# Patient Record
Sex: Male | Born: 1970 | Race: White | Hispanic: No | Smoking: Current every day smoker
Health system: Southern US, Community
[De-identification: ages and names within clinical notes are randomized; demographics above are authoritative.]

## PROBLEM LIST (undated history)

## (undated) DIAGNOSIS — F17200 Nicotine dependence, unspecified, uncomplicated: Secondary | ICD-10-CM

## (undated) DIAGNOSIS — K589 Irritable bowel syndrome without diarrhea: Secondary | ICD-10-CM

## (undated) DIAGNOSIS — Z87442 Personal history of urinary calculi: Secondary | ICD-10-CM

## (undated) DIAGNOSIS — D1803 Hemangioma of intra-abdominal structures: Secondary | ICD-10-CM

## (undated) DIAGNOSIS — IMO0002 Reserved for concepts with insufficient information to code with codable children: Secondary | ICD-10-CM

## (undated) DIAGNOSIS — M5410 Radiculopathy, site unspecified: Secondary | ICD-10-CM

## (undated) HISTORY — PX: DENTAL SURGERY: SHX609

## (undated) HISTORY — PX: BACK SURGERY: SHX140

## (undated) HISTORY — PX: HERNIA REPAIR: SHX51

---

## 2008-03-31 HISTORY — PX: LUMBAR DISC SURGERY: SHX700

## 2009-07-29 ENCOUNTER — Emergency Department (HOSPITAL_COMMUNITY): Admission: EM | Admit: 2009-07-29 | Discharge: 2009-07-29 | Payer: Self-pay | Admitting: Emergency Medicine

## 2012-03-31 DIAGNOSIS — Z87442 Personal history of urinary calculi: Secondary | ICD-10-CM

## 2012-03-31 HISTORY — DX: Personal history of urinary calculi: Z87.442

## 2013-04-18 ENCOUNTER — Other Ambulatory Visit: Payer: Self-pay | Admitting: Orthopedic Surgery

## 2013-04-21 ENCOUNTER — Encounter (HOSPITAL_COMMUNITY): Payer: Self-pay | Admitting: Pharmacy Technician

## 2013-04-22 ENCOUNTER — Encounter (HOSPITAL_COMMUNITY): Payer: Self-pay

## 2013-04-22 ENCOUNTER — Encounter (HOSPITAL_COMMUNITY)
Admission: RE | Admit: 2013-04-22 | Discharge: 2013-04-22 | Disposition: A | Discharge status: Home / Self Care | Payer: BC Managed Care – PPO | Source: Ambulatory Visit | Attending: Orthopedic Surgery | Admitting: Orthopedic Surgery

## 2013-04-22 ENCOUNTER — Ambulatory Visit (HOSPITAL_COMMUNITY)
Admission: RE | Admit: 2013-04-22 | Discharge: 2013-04-22 | Disposition: A | Discharge status: Home / Self Care | Payer: BC Managed Care – PPO | Source: Ambulatory Visit | Attending: Orthopedic Surgery | Admitting: Orthopedic Surgery

## 2013-04-22 DIAGNOSIS — Z01810 Encounter for preprocedural cardiovascular examination: Secondary | ICD-10-CM | POA: Insufficient documentation

## 2013-04-22 DIAGNOSIS — Z01812 Encounter for preprocedural laboratory examination: Secondary | ICD-10-CM | POA: Insufficient documentation

## 2013-04-22 DIAGNOSIS — Z01818 Encounter for other preprocedural examination: Secondary | ICD-10-CM | POA: Insufficient documentation

## 2013-04-22 HISTORY — DX: Irritable bowel syndrome, unspecified: K58.9

## 2013-04-22 HISTORY — DX: Reserved for concepts with insufficient information to code with codable children: IMO0002

## 2013-04-22 HISTORY — DX: Hemangioma of intra-abdominal structures: D18.03

## 2013-04-22 HISTORY — DX: Nicotine dependence, unspecified, uncomplicated: F17.200

## 2013-04-22 HISTORY — DX: Personal history of urinary calculi: Z87.442

## 2013-04-22 LAB — COMPREHENSIVE METABOLIC PANEL
ALBUMIN: 4 g/dL (ref 3.5–5.2)
ALT: 40 U/L (ref 0–53)
AST: 31 U/L (ref 0–37)
Alkaline Phosphatase: 67 U/L (ref 39–117)
BUN: 11 mg/dL (ref 6–23)
CALCIUM: 9.1 mg/dL (ref 8.4–10.5)
CHLORIDE: 100 meq/L (ref 96–112)
CO2: 29 mEq/L (ref 19–32)
Creatinine, Ser: 1 mg/dL (ref 0.50–1.35)
GLUCOSE: 71 mg/dL (ref 70–99)
POTASSIUM: 4.4 meq/L (ref 3.7–5.3)
SODIUM: 141 meq/L (ref 137–147)
TOTAL PROTEIN: 7.1 g/dL (ref 6.0–8.3)
Total Bilirubin: 0.2 mg/dL — ABNORMAL LOW (ref 0.3–1.2)

## 2013-04-22 LAB — URINALYSIS, ROUTINE W REFLEX MICROSCOPIC
Bilirubin Urine: NEGATIVE
GLUCOSE, UA: NEGATIVE mg/dL
HGB URINE DIPSTICK: NEGATIVE
Ketones, ur: NEGATIVE mg/dL
LEUKOCYTES UA: NEGATIVE
Nitrite: NEGATIVE
PROTEIN: NEGATIVE mg/dL
SPECIFIC GRAVITY, URINE: 1.01 (ref 1.005–1.030)
Urobilinogen, UA: 0.2 mg/dL (ref 0.0–1.0)
pH: 7 (ref 5.0–8.0)

## 2013-04-22 LAB — CBC WITH DIFFERENTIAL/PLATELET
BASOS ABS: 0 10*3/uL (ref 0.0–0.1)
Basophils Relative: 0 % (ref 0–1)
EOS PCT: 3 % (ref 0–5)
Eosinophils Absolute: 0.2 10*3/uL (ref 0.0–0.7)
HCT: 38.6 % — ABNORMAL LOW (ref 39.0–52.0)
HEMOGLOBIN: 12.9 g/dL — AB (ref 13.0–17.0)
Lymphocytes Relative: 44 % (ref 12–46)
Lymphs Abs: 2.8 10*3/uL (ref 0.7–4.0)
MCH: 32 pg (ref 26.0–34.0)
MCHC: 33.4 g/dL (ref 30.0–36.0)
MCV: 95.8 fL (ref 78.0–100.0)
Monocytes Absolute: 0.7 10*3/uL (ref 0.1–1.0)
Monocytes Relative: 11 % (ref 3–12)
NEUTROS ABS: 2.7 10*3/uL (ref 1.7–7.7)
Neutrophils Relative %: 43 % (ref 43–77)
Platelets: 295 10*3/uL (ref 150–400)
RBC: 4.03 MIL/uL — AB (ref 4.22–5.81)
RDW: 12.2 % (ref 11.5–15.5)
WBC: 6.3 10*3/uL (ref 4.0–10.5)

## 2013-04-22 LAB — SURGICAL PCR SCREEN
MRSA, PCR: NEGATIVE
Staphylococcus aureus: POSITIVE — AB

## 2013-04-22 LAB — ABO/RH: ABO/RH(D): O POS

## 2013-04-22 LAB — TYPE AND SCREEN
ABO/RH(D): O POS
ANTIBODY SCREEN: NEGATIVE

## 2013-04-22 LAB — PROTIME-INR
INR: 0.94 (ref 0.00–1.49)
Prothrombin Time: 12.4 seconds (ref 11.6–15.2)

## 2013-04-22 LAB — APTT: aPTT: 32 seconds (ref 24–37)

## 2013-04-22 NOTE — Pre-Procedure Instructions (Signed)
Travis Peters  04/22/2013   Your procedure is scheduled on:  Wednesday, Feb 4  Report to [facility] Short Stay,Main entrance A at 0630 AM.  Call this number if you have problems the morning of surgery: [phone]   Remember:   Do not eat food or drink liquids after midnight.Tuesday,   Take these medicines the morning of surgery with A SIP OF WATER: pregabalin(  Lyrica), escitalopram( Lexapro),cyclobenzaprine (Flexeril)   Do not wear jewelry   Do not wear lotions, powders, or perfumes. You may wear deodorant.             Men may shave face and neck.  Do not bring valuables to the hospital.  [facility] is not responsible   for any belongings or valuables.               Contacts, dentures or bridgework may not be worn into surgery.  Leave suitcase in the car. After surgery it may be brought to your room.  For patients admitted to the hospital, discharge time is determined by your   treatment team.                Special Instructions: Shower using CHG 2 nights before surgery and the night before surgery.  If you shower the day of surgery use CHG.  Use special wash - you have one bottle of CHG for all showers.  You should use approximately 1/3 of the bottle for each shower.   Please read over the following fact sheets that you were given: Pain Booklet, Coughing and Deep Breathing, Blood Transfusion Information, MRSA Information and Surgical Site Infection Prevention

## 2013-04-27 ENCOUNTER — Other Ambulatory Visit (HOSPITAL_COMMUNITY): Payer: Self-pay

## 2013-05-03 MED ORDER — CHLORHEXIDINE GLUCONATE 4 % EX LIQD: 1.0000 "application " | Freq: Once | CUTANEOUS | Status: DC

## 2013-05-03 MED ORDER — CEFAZOLIN SODIUM-DEXTROSE 2-3 GM-% IV SOLR
2.0000 g | INTRAVENOUS | Status: AC
  Administered 2013-05-04: 2 g via INTRAVENOUS

## 2013-05-03 MED ORDER — POVIDONE-IODINE 7.5 % EX SOLN
Freq: Once | CUTANEOUS | Status: DC
  Filled 2013-05-03: qty 118

## 2013-05-04 ENCOUNTER — Encounter (HOSPITAL_COMMUNITY): Payer: Self-pay | Admitting: Certified Registered"

## 2013-05-04 ENCOUNTER — Inpatient Hospital Stay (HOSPITAL_COMMUNITY): Payer: BC Managed Care – PPO

## 2013-05-04 ENCOUNTER — Inpatient Hospital Stay (HOSPITAL_COMMUNITY)
Admission: RE | Admit: 2013-05-04 | Discharge: 2013-05-08 | DRG: 460 | Disposition: A | Discharge status: Home Health | Payer: BC Managed Care – PPO | Source: Ambulatory Visit | Attending: Orthopedic Surgery | Admitting: Orthopedic Surgery

## 2013-05-04 ENCOUNTER — Inpatient Hospital Stay (HOSPITAL_COMMUNITY): Payer: BC Managed Care – PPO | Admitting: Certified Registered"

## 2013-05-04 ENCOUNTER — Encounter (HOSPITAL_COMMUNITY)
Admission: RE | Disposition: A | Discharge status: Home Health | Payer: BC Managed Care – PPO | Source: Ambulatory Visit | Attending: Orthopedic Surgery

## 2013-05-04 ENCOUNTER — Encounter (HOSPITAL_COMMUNITY): Payer: BC Managed Care – PPO | Admitting: Certified Registered"

## 2013-05-04 DIAGNOSIS — M5137 Other intervertebral disc degeneration, lumbosacral region: Secondary | ICD-10-CM | POA: Diagnosis present

## 2013-05-04 DIAGNOSIS — M51379 Other intervertebral disc degeneration, lumbosacral region without mention of lumbar back pain or lower extremity pain: Secondary | ICD-10-CM | POA: Diagnosis present

## 2013-05-04 DIAGNOSIS — Z87442 Personal history of urinary calculi: Secondary | ICD-10-CM

## 2013-05-04 DIAGNOSIS — M5126 Other intervertebral disc displacement, lumbar region: Principal | ICD-10-CM | POA: Diagnosis present

## 2013-05-04 DIAGNOSIS — F17200 Nicotine dependence, unspecified, uncomplicated: Secondary | ICD-10-CM | POA: Diagnosis present

## 2013-05-04 DIAGNOSIS — M5410 Radiculopathy, site unspecified: Secondary | ICD-10-CM | POA: Diagnosis present

## 2013-05-04 DIAGNOSIS — M5380 Other specified dorsopathies, site unspecified: Secondary | ICD-10-CM | POA: Diagnosis present

## 2013-05-04 DIAGNOSIS — K589 Irritable bowel syndrome without diarrhea: Secondary | ICD-10-CM | POA: Diagnosis present

## 2013-05-04 HISTORY — DX: Radiculopathy, site unspecified: M54.10

## 2013-05-04 SURGERY — POSTERIOR LUMBAR FUSION 1 LEVEL
Anesthesia: General | Site: Spine Lumbar | Laterality: Left

## 2013-05-04 MED ORDER — PHENOL 1.4 % MT LIQD: 1.0000 | OROMUCOSAL | Status: DC | PRN

## 2013-05-04 MED ORDER — SUCCINYLCHOLINE CHLORIDE 20 MG/ML IJ SOLN
INTRAMUSCULAR | Status: DC | PRN
  Administered 2013-05-04: 100 mg via INTRAVENOUS

## 2013-05-04 MED ORDER — NEOSTIGMINE METHYLSULFATE 1 MG/ML IJ SOLN
INTRAMUSCULAR | Status: AC
  Filled 2013-05-04: qty 10

## 2013-05-04 MED ORDER — DIPHENHYDRAMINE HCL 50 MG/ML IJ SOLN
12.5000 mg | Freq: Four times a day (QID) | INTRAMUSCULAR | Status: DC | PRN
  Filled 2013-05-04: qty 0.25

## 2013-05-04 MED ORDER — MIDAZOLAM HCL 2 MG/2ML IJ SOLN
0.5000 mg | Freq: Once | INTRAMUSCULAR | Status: AC
  Administered 2013-05-04: 0.5 mg via INTRAVENOUS

## 2013-05-04 MED ORDER — MORPHINE SULFATE (PF) 1 MG/ML IV SOLN
INTRAVENOUS | Status: AC
  Filled 2013-05-04: qty 25

## 2013-05-04 MED ORDER — HYDROMORPHONE 0.3 MG/ML IV SOLN
INTRAVENOUS | Status: DC
  Administered 2013-05-04: 16:00:00 via INTRAVENOUS

## 2013-05-04 MED ORDER — MIDAZOLAM HCL 2 MG/2ML IJ SOLN
INTRAMUSCULAR | Status: AC
  Filled 2013-05-04: qty 2

## 2013-05-04 MED ORDER — MENTHOL 3 MG MT LOZG: 1.0000 | LOZENGE | OROMUCOSAL | Status: DC | PRN

## 2013-05-04 MED ORDER — HYDROMORPHONE HCL PF 1 MG/ML IJ SOLN
INTRAMUSCULAR | Status: AC
  Filled 2013-05-04: qty 2

## 2013-05-04 MED ORDER — ROCURONIUM BROMIDE 50 MG/5ML IV SOLN
INTRAVENOUS | Status: AC
  Filled 2013-05-04: qty 1

## 2013-05-04 MED ORDER — THROMBIN 20000 UNITS EX SOLR
CUTANEOUS | Status: DC | PRN
  Administered 2013-05-04: 20000 [IU] via TOPICAL

## 2013-05-04 MED ORDER — LIDOCAINE HCL (CARDIAC) 20 MG/ML IV SOLN
INTRAVENOUS | Status: DC | PRN
  Administered 2013-05-04: 60 mg via INTRAVENOUS

## 2013-05-04 MED ORDER — THROMBIN 20000 UNITS EX SOLR
CUTANEOUS | Status: AC
Start: 2013-05-04 — End: 2013-05-04
  Filled 2013-05-04: qty 40000

## 2013-05-04 MED ORDER — DIAZEPAM 5 MG PO TABS
5.0000 mg | ORAL_TABLET | Freq: Four times a day (QID) | ORAL | Status: DC | PRN
  Administered 2013-05-04 – 2013-05-08 (×8): 5 mg via ORAL
  Filled 2013-05-04 (×9): qty 1

## 2013-05-04 MED ORDER — SUFENTANIL CITRATE 50 MCG/ML IV SOLN
INTRAVENOUS | Status: DC | PRN
  Administered 2013-05-04 (×2): 10 ug via INTRAVENOUS
  Administered 2013-05-04 (×2): 20 ug via INTRAVENOUS
  Administered 2013-05-04 (×2): 10 ug via INTRAVENOUS

## 2013-05-04 MED ORDER — PROPOFOL 10 MG/ML IV BOLUS
INTRAVENOUS | Status: AC
  Filled 2013-05-04: qty 20

## 2013-05-04 MED ORDER — PROPOFOL 10 MG/ML IV BOLUS
INTRAVENOUS | Status: DC | PRN
  Administered 2013-05-04: 200 mg via INTRAVENOUS

## 2013-05-04 MED ORDER — SODIUM CHLORIDE 0.9 % IV SOLN
250.0000 mL | INTRAVENOUS | Status: DC
Start: 2013-05-04 — End: 2013-05-08

## 2013-05-04 MED ORDER — 0.9 % SODIUM CHLORIDE (POUR BTL) OPTIME
TOPICAL | Status: DC | PRN
  Administered 2013-05-04: 1000 mL

## 2013-05-04 MED ORDER — MORPHINE SULFATE 2 MG/ML IJ SOLN
1.0000 mg | INTRAMUSCULAR | Status: DC | PRN
  Administered 2013-05-04 (×2): 2 mg via INTRAVENOUS
  Administered 2013-05-05 – 2013-05-06 (×5): 4 mg via INTRAVENOUS
  Filled 2013-05-04 (×5): qty 2

## 2013-05-04 MED ORDER — SODIUM CHLORIDE 0.9 % IJ SOLN: 9.0000 mL | INTRAMUSCULAR | Status: DC | PRN

## 2013-05-04 MED ORDER — HYDROMORPHONE HCL PF 1 MG/ML IJ SOLN
0.2500 mg | INTRAMUSCULAR | Status: DC | PRN
  Administered 2013-05-04 (×4): 0.5 mg via INTRAVENOUS

## 2013-05-04 MED ORDER — NALOXONE HCL 0.4 MG/ML IJ SOLN: 0.4000 mg | INTRAMUSCULAR | Status: DC | PRN

## 2013-05-04 MED ORDER — ACETAMINOPHEN 650 MG RE SUPP: 650.0000 mg | RECTAL | Status: DC | PRN

## 2013-05-04 MED ORDER — SUFENTANIL CITRATE 50 MCG/ML IV SOLN
INTRAVENOUS | Status: AC
  Filled 2013-05-04: qty 1

## 2013-05-04 MED ORDER — PROPOFOL 10 MG/ML IV EMUL
INTRAVENOUS | Status: AC
  Filled 2013-05-04: qty 100

## 2013-05-04 MED ORDER — ACETAMINOPHEN 325 MG PO TABS: 650.0000 mg | ORAL_TABLET | ORAL | Status: DC | PRN

## 2013-05-04 MED ORDER — BISACODYL 5 MG PO TBEC
5.0000 mg | DELAYED_RELEASE_TABLET | Freq: Every day | ORAL | Status: DC | PRN
  Filled 2013-05-04: qty 1

## 2013-05-04 MED ORDER — BUPIVACAINE-EPINEPHRINE 0.25% -1:200000 IJ SOLN
INTRAMUSCULAR | Status: DC | PRN
  Administered 2013-05-04: 28 mL

## 2013-05-04 MED ORDER — SODIUM CHLORIDE 0.9 % IV SOLN
INTRAVENOUS | Status: DC
  Administered 2013-05-05 – 2013-05-06 (×3): via INTRAVENOUS

## 2013-05-04 MED ORDER — ONDANSETRON HCL 4 MG/2ML IJ SOLN
INTRAMUSCULAR | Status: AC
  Filled 2013-05-04: qty 2

## 2013-05-04 MED ORDER — DIPHENHYDRAMINE HCL 12.5 MG/5ML PO ELIX
12.5000 mg | ORAL_SOLUTION | Freq: Four times a day (QID) | ORAL | Status: DC | PRN
  Filled 2013-05-04: qty 5

## 2013-05-04 MED ORDER — ONDANSETRON HCL 4 MG/2ML IJ SOLN: 4.0000 mg | Freq: Four times a day (QID) | INTRAMUSCULAR | Status: DC | PRN

## 2013-05-04 MED ORDER — MORPHINE SULFATE 2 MG/ML IJ SOLN
INTRAMUSCULAR | Status: AC
  Filled 2013-05-04: qty 1

## 2013-05-04 MED ORDER — SODIUM CHLORIDE 0.9 % IJ SOLN
INTRAMUSCULAR | Status: AC
Start: 2013-05-04 — End: 2013-05-04
  Filled 2013-05-04: qty 10

## 2013-05-04 MED ORDER — PROPOFOL INFUSION 10 MG/ML OPTIME
INTRAVENOUS | Status: DC | PRN
  Administered 2013-05-04: 75 ug/kg/min via INTRAVENOUS

## 2013-05-04 MED ORDER — LIDOCAINE HCL (CARDIAC) 20 MG/ML IV SOLN
INTRAVENOUS | Status: AC
  Filled 2013-05-04: qty 5

## 2013-05-04 MED ORDER — MIDAZOLAM HCL 5 MG/5ML IJ SOLN
INTRAMUSCULAR | Status: DC | PRN
  Administered 2013-05-04: 2 mg via INTRAVENOUS

## 2013-05-04 MED ORDER — BUPIVACAINE-EPINEPHRINE (PF) 0.25% -1:200000 IJ SOLN
INTRAMUSCULAR | Status: AC
Start: 2013-05-04 — End: 2013-05-04
  Filled 2013-05-04: qty 30

## 2013-05-04 MED ORDER — CEFAZOLIN SODIUM 1-5 GM-% IV SOLN
1.0000 g | Freq: Three times a day (TID) | INTRAVENOUS | Status: AC
  Administered 2013-05-04 – 2013-05-05 (×2): 1 g via INTRAVENOUS
  Filled 2013-05-04 (×2): qty 50

## 2013-05-04 MED ORDER — ZOLPIDEM TARTRATE 5 MG PO TABS: 5.0000 mg | ORAL_TABLET | Freq: Every evening | ORAL | Status: DC | PRN

## 2013-05-04 MED ORDER — DIPHENHYDRAMINE HCL 12.5 MG/5ML PO ELIX: 12.5000 mg | ORAL_SOLUTION | Freq: Four times a day (QID) | ORAL | Status: DC | PRN

## 2013-05-04 MED ORDER — MIDAZOLAM HCL 2 MG/2ML IJ SOLN
INTRAMUSCULAR | Status: AC
Start: 2013-05-04 — End: 2013-05-04
  Filled 2013-05-04: qty 2

## 2013-05-04 MED ORDER — FLEET ENEMA 7-19 GM/118ML RE ENEM: 1.0000 | ENEMA | Freq: Once | RECTAL | Status: AC | PRN

## 2013-05-04 MED ORDER — ALUM & MAG HYDROXIDE-SIMETH 200-200-20 MG/5ML PO SUSP: 30.0000 mL | Freq: Four times a day (QID) | ORAL | Status: DC | PRN

## 2013-05-04 MED ORDER — LACTATED RINGERS IV SOLN
INTRAVENOUS | Status: DC | PRN
  Administered 2013-05-04 (×3): via INTRAVENOUS

## 2013-05-04 MED ORDER — ONDANSETRON HCL 4 MG/2ML IJ SOLN
INTRAMUSCULAR | Status: DC | PRN
  Administered 2013-05-04: 4 mg via INTRAVENOUS

## 2013-05-04 MED ORDER — MORPHINE SULFATE (PF) 1 MG/ML IV SOLN
INTRAVENOUS | Status: DC
  Administered 2013-05-04: 22 mg via INTRAVENOUS
  Administered 2013-05-04: 13:00:00 via INTRAVENOUS

## 2013-05-04 MED ORDER — OXYCODONE-ACETAMINOPHEN 5-325 MG PO TABS
1.0000 | ORAL_TABLET | ORAL | Status: DC | PRN
  Administered 2013-05-05 – 2013-05-08 (×12): 2 via ORAL
  Filled 2013-05-04 (×12): qty 2

## 2013-05-04 MED ORDER — SODIUM CHLORIDE 0.9 % IJ SOLN: 3.0000 mL | INTRAMUSCULAR | Status: DC | PRN

## 2013-05-04 MED ORDER — DIPHENHYDRAMINE HCL 50 MG/ML IJ SOLN: 12.5000 mg | Freq: Four times a day (QID) | INTRAMUSCULAR | Status: DC | PRN

## 2013-05-04 MED ORDER — PREGABALIN 50 MG PO CAPS: 75.0000 mg | ORAL_CAPSULE | Freq: Every evening | ORAL | Status: DC | PRN

## 2013-05-04 MED ORDER — ONDANSETRON HCL 4 MG/2ML IJ SOLN
4.0000 mg | Freq: Four times a day (QID) | INTRAMUSCULAR | Status: DC | PRN
Start: 2013-05-04 — End: 2013-05-05
  Filled 2013-05-04: qty 2

## 2013-05-04 MED ORDER — THROMBIN 20000 UNITS EX SOLR
CUTANEOUS | Status: DC | PRN
  Administered 2013-05-04: 10:00:00

## 2013-05-04 MED ORDER — HYDROMORPHONE 0.3 MG/ML IV SOLN
INTRAVENOUS | Status: DC
  Administered 2013-05-04: 2.4 mg via INTRAVENOUS
  Administered 2013-05-05: 3.3 mg via INTRAVENOUS
  Administered 2013-05-05: 3.58 mg via INTRAVENOUS
  Filled 2013-05-04: qty 25

## 2013-05-04 MED ORDER — CLONAZEPAM 0.5 MG PO TABS
0.5000 mg | ORAL_TABLET | Freq: Two times a day (BID) | ORAL | Status: DC | PRN
  Filled 2013-05-04 (×2): qty 1

## 2013-05-04 MED ORDER — ROCURONIUM BROMIDE 100 MG/10ML IV SOLN
INTRAVENOUS | Status: DC | PRN
  Administered 2013-05-04: 20 mg via INTRAVENOUS
  Administered 2013-05-04: 50 mg via INTRAVENOUS

## 2013-05-04 MED ORDER — HYDROMORPHONE 0.3 MG/ML IV SOLN
INTRAVENOUS | Status: AC
  Filled 2013-05-04: qty 25

## 2013-05-04 MED ORDER — SENNOSIDES-DOCUSATE SODIUM 8.6-50 MG PO TABS: 1.0000 | ORAL_TABLET | Freq: Every evening | ORAL | Status: DC | PRN

## 2013-05-04 MED ORDER — NALOXONE HCL 0.4 MG/ML IJ SOLN
0.4000 mg | INTRAMUSCULAR | Status: DC | PRN
  Filled 2013-05-04: qty 1

## 2013-05-04 MED ORDER — ESCITALOPRAM OXALATE 20 MG PO TABS
20.0000 mg | ORAL_TABLET | Freq: Every day | ORAL | Status: DC
  Administered 2013-05-05 – 2013-05-08 (×4): 20 mg via ORAL
  Filled 2013-05-04 (×4): qty 1

## 2013-05-04 MED ORDER — DOCUSATE SODIUM 100 MG PO CAPS
100.0000 mg | ORAL_CAPSULE | Freq: Two times a day (BID) | ORAL | Status: DC
  Administered 2013-05-04 – 2013-05-08 (×8): 100 mg via ORAL
  Filled 2013-05-04 (×10): qty 1

## 2013-05-04 MED ORDER — METHYLENE BLUE 1 % INJ SOLN
INTRAMUSCULAR | Status: AC
  Filled 2013-05-04: qty 10

## 2013-05-04 MED ORDER — SODIUM CHLORIDE 0.9 % IJ SOLN
3.0000 mL | Freq: Two times a day (BID) | INTRAMUSCULAR | Status: DC
  Administered 2013-05-06 – 2013-05-08 (×4): 3 mL via INTRAVENOUS

## 2013-05-04 MED ORDER — ONDANSETRON HCL 4 MG/2ML IJ SOLN
4.0000 mg | INTRAMUSCULAR | Status: DC | PRN
  Administered 2013-05-05 – 2013-05-06 (×4): 4 mg via INTRAVENOUS
  Filled 2013-05-04 (×4): qty 2

## 2013-05-04 SURGICAL SUPPLY — 77 items
BENZOIN TINCTURE PRP APPL 2/3 (GAUZE/BANDAGES/DRESSINGS) ×3 IMPLANT
BLADE SURG ROTATE 9660 (MISCELLANEOUS) IMPLANT
BUR ROUND PRECISION 4.0 (BURR) ×2 IMPLANT
BUR ROUND PRECISION 4.0MM (BURR) ×1
CAGE CONCORDE BULLET 9X10X27 (Cage) ×3 IMPLANT
CARTRIDGE OIL MAESTRO DRILL (MISCELLANEOUS) ×2 IMPLANT
CLOSURE WOUND 1/2 X4 (GAUZE/BANDAGES/DRESSINGS) ×1
CLOTH BEACON ORANGE TIMEOUT ST (SAFETY) ×3 IMPLANT
CONT SPEC STER OR (MISCELLANEOUS) ×3 IMPLANT
CORDS BIPOLAR (ELECTRODE) ×3 IMPLANT
COVER SURGICAL LIGHT HANDLE (MISCELLANEOUS) ×3 IMPLANT
DIFFUSER DRILL AIR PNEUMATIC (MISCELLANEOUS) ×6 IMPLANT
DRAIN CHANNEL 15F RND FF W/TCR (WOUND CARE) IMPLANT
DRAPE C-ARM 42X72 X-RAY (DRAPES) ×3 IMPLANT
DRAPE ORTHO SPLIT 77X108 STRL (DRAPES) ×2
DRAPE POUCH INSTRU U-SHP 10X18 (DRAPES) ×3 IMPLANT
DRAPE SURG 17X23 STRL (DRAPES) ×9 IMPLANT
DRAPE SURG ORHT 6 SPLT 77X108 (DRAPES) ×1 IMPLANT
DURAPREP 26ML APPLICATOR (WOUND CARE) ×3 IMPLANT
ELECT BLADE 4.0 EZ CLEAN MEGAD (MISCELLANEOUS) ×3
ELECT CAUTERY BLADE 6.4 (BLADE) ×3 IMPLANT
ELECT REM PT RETURN 9FT ADLT (ELECTROSURGICAL) ×3
ELECTRODE BLDE 4.0 EZ CLN MEGD (MISCELLANEOUS) ×1 IMPLANT
ELECTRODE REM PT RTRN 9FT ADLT (ELECTROSURGICAL) ×1 IMPLANT
EVACUATOR SILICONE 100CC (DRAIN) IMPLANT
GAUZE SPONGE 4X4 16PLY XRAY LF (GAUZE/BANDAGES/DRESSINGS) ×12 IMPLANT
GLOVE BIO SURGEON STRL SZ7 (GLOVE) ×3 IMPLANT
GLOVE BIO SURGEON STRL SZ8 (GLOVE) ×3 IMPLANT
GLOVE BIOGEL PI IND STRL 7.5 (GLOVE) ×1 IMPLANT
GLOVE BIOGEL PI IND STRL 8 (GLOVE) ×1 IMPLANT
GLOVE BIOGEL PI INDICATOR 7.5 (GLOVE) ×2
GLOVE BIOGEL PI INDICATOR 8 (GLOVE) ×2
GOWN STRL NON-REIN LRG LVL3 (GOWN DISPOSABLE) ×6 IMPLANT
GOWN STRL REIN XL XLG (GOWN DISPOSABLE) ×3 IMPLANT
IV CATH 14GX2 1/4 (CATHETERS) ×3 IMPLANT
KIT BASIN OR (CUSTOM PROCEDURE TRAY) ×3 IMPLANT
KIT POSITION SURG JACKSON T1 (MISCELLANEOUS) ×3 IMPLANT
KIT ROOM TURNOVER OR (KITS) ×3 IMPLANT
MARKER SKIN DUAL TIP RULER LAB (MISCELLANEOUS) ×3 IMPLANT
MIX DBX 10CC 35% BONE (Bone Implant) ×3 IMPLANT
NEEDLE BONE MARROW 8GX6 FENEST (NEEDLE) IMPLANT
NEEDLE HYPO 25GX1X1/2 BEV (NEEDLE) ×3 IMPLANT
NEEDLE SPNL 18GX3.5 QUINCKE PK (NEEDLE) ×6 IMPLANT
NEURO MONITORING STIM (LABOR (TRAVEL & OVERTIME)) ×3 IMPLANT
NS IRRIG 1000ML POUR BTL (IV SOLUTION) ×3 IMPLANT
OIL CARTRIDGE MAESTRO DRILL (MISCELLANEOUS) ×6
PACK LAMINECTOMY ORTHO (CUSTOM PROCEDURE TRAY) ×3 IMPLANT
PACK UNIVERSAL I (CUSTOM PROCEDURE TRAY) ×3 IMPLANT
PAD ARMBOARD 7.5X6 YLW CONV (MISCELLANEOUS) ×6 IMPLANT
PATTIES SURGICAL .5 X1 (DISPOSABLE) ×3 IMPLANT
PATTIES SURGICAL .5X1.5 (GAUZE/BANDAGES/DRESSINGS) ×3 IMPLANT
ROD PRE BENT EXPEDIUM 35MM (Rod) ×6 IMPLANT
SCREW EXPEDIUM POLYAXIAL 7X40M (Screw) ×6 IMPLANT
SCREW POLYAXIAL 7X45MM (Screw) ×6 IMPLANT
SCREW SET SINGLE INNER (Screw) ×12 IMPLANT
SPONGE GAUZE 4X4 12PLY (GAUZE/BANDAGES/DRESSINGS) ×3 IMPLANT
SPONGE GAUZE 4X4 12PLY STER LF (GAUZE/BANDAGES/DRESSINGS) ×3 IMPLANT
SPONGE INTESTINAL PEANUT (DISPOSABLE) ×3 IMPLANT
SPONGE SURGIFOAM ABS GEL 100 (HEMOSTASIS) ×3 IMPLANT
STRIP CLOSURE SKIN 1/2X4 (GAUZE/BANDAGES/DRESSINGS) ×2 IMPLANT
SURGIFLO TRUKIT (HEMOSTASIS) IMPLANT
SUT MNCRL AB 4-0 PS2 18 (SUTURE) ×6 IMPLANT
SUT VIC AB 0 CT1 18XCR BRD 8 (SUTURE) ×1 IMPLANT
SUT VIC AB 0 CT1 8-18 (SUTURE) ×2
SUT VIC AB 1 CT1 18XCR BRD 8 (SUTURE) ×2 IMPLANT
SUT VIC AB 1 CT1 8-18 (SUTURE) ×4
SUT VIC AB 2-0 CT2 18 VCP726D (SUTURE) ×3 IMPLANT
SYR 20CC LL (SYRINGE) ×3 IMPLANT
SYR BULB IRRIGATION 50ML (SYRINGE) ×3 IMPLANT
SYR CONTROL 10ML LL (SYRINGE) ×3 IMPLANT
SYR TB 1ML LUER SLIP (SYRINGE) ×3 IMPLANT
TAPE CLOTH SURG 4X10 WHT LF (GAUZE/BANDAGES/DRESSINGS) ×3 IMPLANT
TOWEL OR 17X24 6PK STRL BLUE (TOWEL DISPOSABLE) ×3 IMPLANT
TOWEL OR 17X26 10 PK STRL BLUE (TOWEL DISPOSABLE) ×3 IMPLANT
TRAY FOLEY CATH 16FRSI W/METER (SET/KITS/TRAYS/PACK) ×3 IMPLANT
WATER STERILE IRR 1000ML POUR (IV SOLUTION) IMPLANT
YANKAUER SUCT BULB TIP NO VENT (SUCTIONS) ×3 IMPLANT

## 2013-05-04 NOTE — Anesthesia Preprocedure Evaluation (Addendum)
Anesthesia Evaluation  Patient identified by MRN, date of birth, ID band Patient awake    Reviewed: Allergy & Precautions, H&P , NPO status , Patient's Chart, lab work & pertinent test results  Airway Mallampati: II TM Distance: >3 FB Neck ROM: Full    Dental  (+) Teeth Intact and Dental Advisory Given   Pulmonary Current Smoker,  breath sounds clear to auscultation        Cardiovascular negative cardio ROS  Rhythm:Regular Rate:Normal     Neuro/Psych    GI/Hepatic negative GI ROS, Neg liver ROS,   Endo/Other    Renal/GU      Musculoskeletal   Abdominal   Peds  Hematology   Anesthesia Other Findings   Reproductive/Obstetrics                        Anesthesia Physical Anesthesia Plan  ASA: II  Anesthesia Plan: General   Post-op Pain Management:    Induction: Intravenous  Airway Management Planned: Oral ETT  Additional Equipment:   Intra-op Plan:   Post-operative Plan: Extubation in OR  Informed Consent: I have reviewed the patients History and Physical, chart, labs and discussed the procedure including the risks, benefits and alternatives for the proposed anesthesia with the patient or authorized representative who has indicated his/her understanding and acceptance.   Dental advisory given  Plan Discussed with: CRNA, Anesthesiologist and Surgeon  Anesthesia Plan Comments:         Anesthesia Quick Evaluation

## 2013-05-04 NOTE — Preoperative (Signed)
Beta Blockers   Reason not to administer Beta Blockers:Not Applicable 

## 2013-05-04 NOTE — Anesthesia Postprocedure Evaluation (Signed)
  Anesthesia Post-op Note  Patient: Travis Peters  Procedure(s) Performed: Procedure(s) with comments: TRANSFORAMINAL LUMBAR INTERBODY FUSION (TLIF) WITH PEDICLE SCREW FIXATION 1 LEVEL (Left) - Left sided lumbar 5-sacrum 1 Transforaminal lumbar interbody fusion with instrumentation, allograft  Patient Location: PACU  Anesthesia Type:General  Level of Consciousness: awake  Airway and Oxygen Therapy: Patient Spontanous Breathing  Post-op Pain: mild  Post-op Assessment: Post-op Vital signs reviewed  Post-op Vital Signs: Reviewed  Complications: No apparent anesthesia complications

## 2013-05-04 NOTE — Progress Notes (Signed)
Dr Oletta Lamas aware pt cont to have pain 10/10.  No orders received.  Instructed to give pain meds per MD floor orders

## 2013-05-04 NOTE — Progress Notes (Signed)
Pt cries, pain still 10/10.  Order receiced to give verses up to 1 mg per Dr Oletta Lamas

## 2013-05-04 NOTE — H&P (Signed)
PREOPERATIVE H&P  Chief Complaint: left leg pain  HPI: Travis Peters is a 43 y.o. male who presents with a 3 months h/o left leg pain. Patient is s/p a lumbar decompressive procedure 5 years ago. 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 Patient c/o ongoing leg pain and weakness.   Past Medical History  Diagnosis Date  . History of kidney stones 2014  . Current smoker   . Herniated intervertebral disc   . Hemangioma of liver     spleen and pancreas  . Irritable bowel syndrome (IBS)    Past Surgical History  Procedure Laterality Date  . Back surgery    . Lumbar disc surgery  2010  . Hernia repair    . Dental surgery     History   Social History  . Marital Status: Divorced    Spouse Name: N/A    Number of Children: N/A  . Years of Education: N/A   Social History Main Topics  . Smoking status: Current Every Day Smoker -- 0.50 packs/day for 28 years    Types: Cigarettes  . Smokeless tobacco: Former Systems developer  . Alcohol Use: No  . Drug Use: No  . Sexual Activity: Yes   Other Topics Concern  . None   Social History Narrative  . None   No family history on file. No Known Allergies Prior to Admission medications   Medication Sig Start Date End Date Taking? Authorizing Provider  acetaminophen (TYLENOL) 500 MG tablet Take 500 mg by mouth 3 (three) times daily. scheduled   Yes Historical Provider, MD  cyclobenzaprine (FLEXERIL) 10 MG tablet Take 10 mg by mouth daily as needed for muscle spasms.   Yes Historical Provider, MD  escitalopram (LEXAPRO) 20 MG tablet Take 20 mg by mouth daily.   Yes Historical Provider, MD  tapentadol (NUCYNTA) 50 MG TABS tablet Take 50 mg by mouth.   Yes Historical Provider, MD  clonazePAM (KLONOPIN) 0.5 MG tablet Take 0.5 mg by mouth as needed for anxiety.    Historical Provider, MD  pregabalin (LYRICA) 75 MG capsule Take 75 mg by mouth at bedtime as needed (nerve pain).    Historical Provider, MD     All  other systems have been reviewed and were otherwise negative with the exception of those mentioned in the HPI and as above.  Physical Exam: Filed Vitals:   05/04/13 0707  BP: 128/61  Pulse: 66  Temp: 97.2 F (36.2 C)  Resp: 16    General: Alert, no acute distress Cardiovascular: No pedal edema Respiratory: No cyanosis, no use of accessory musculature Skin: No lesions in the area of chief complaint Neurologic: Sensation intact distally Psychiatric: Patient is competent for consent with normal mood and affect Lymphatic: No axillary or cervical lymphadenopathy  MUSCULOSKELETAL: + SLR on left  Assessment/Plan: Left leg pain Plan for Procedure(s): TRANSFORAMINAL LUMBAR INTERBODY FUSION (TLIF) WITH PEDICLE SCREW FIXATION 1 LEVEL   Sinclair Ship, MD 05/04/2013 8:07 AM

## 2013-05-04 NOTE — Progress Notes (Signed)
Report given to rebecca slade rn as caregiver

## 2013-05-04 NOTE — Transfer of Care (Signed)
Immediate Anesthesia Transfer of Care Note  Patient: Travis Peters  Procedure(s) Performed: Procedure(s) with comments: TRANSFORAMINAL LUMBAR INTERBODY FUSION (TLIF) WITH PEDICLE SCREW FIXATION 1 LEVEL (Left) - Left sided lumbar 5-sacrum 1 Transforaminal lumbar interbody fusion with instrumentation, allograft  Patient Location: PACU  Anesthesia Type:General  Level of Consciousness: awake, alert  and oriented  Airway & Oxygen Therapy: Patient Spontanous Breathing and Patient connected to nasal cannula oxygen  Post-op Assessment: Report given to PACU RN, Post -op Vital signs reviewed and stable and Patient moving all extremities  Post vital signs: Reviewed and stable  Complications: No apparent anesthesia complications

## 2013-05-04 NOTE — Anesthesia Procedure Notes (Signed)
Procedure Name: Intubation Date/Time: 05/04/2013 8:46 AM Performed by: Melina Copa, Abigael Mogle R Pre-anesthesia Checklist: Patient identified, Emergency Drugs available, Suction available, Patient being monitored and Timeout performed Patient Re-evaluated:Patient Re-evaluated prior to inductionOxygen Delivery Method: Circle system utilized Preoxygenation: Pre-oxygenation with 100% oxygen Intubation Type: IV induction Ventilation: Mask ventilation without difficulty Laryngoscope Size: Mac and 4 Grade View: Grade II Tube type: Oral Tube size: 8.0 mm Number of attempts: 1 Airway Equipment and Method: Stylet Placement Confirmation: ETT inserted through vocal cords under direct vision,  positive ETCO2 and breath sounds checked- equal and bilateral Secured at: 24 cm Tube secured with: Tape Dental Injury: Teeth and Oropharynx as per pre-operative assessment

## 2013-05-05 MED ORDER — CYCLOBENZAPRINE HCL 10 MG PO TABS
10.0000 mg | ORAL_TABLET | Freq: Four times a day (QID) | ORAL | Status: DC | PRN
  Administered 2013-05-05 – 2013-05-07 (×8): 10 mg via ORAL
  Filled 2013-05-05 (×8): qty 1

## 2013-05-05 MED ORDER — CYCLOBENZAPRINE HCL 10 MG PO TABS
10.0000 mg | ORAL_TABLET | Freq: Three times a day (TID) | ORAL | Status: DC | PRN
  Administered 2013-05-05: 10 mg via ORAL
  Filled 2013-05-05: qty 1

## 2013-05-05 MED ORDER — OXYCODONE HCL ER 20 MG PO T12A
20.0000 mg | EXTENDED_RELEASE_TABLET | Freq: Two times a day (BID) | ORAL | Status: DC
  Administered 2013-05-05 – 2013-05-08 (×6): 20 mg via ORAL
  Filled 2013-05-05 (×6): qty 2

## 2013-05-05 MED ORDER — METHOCARBAMOL 100 MG/ML IJ SOLN
500.0000 mg | Freq: Four times a day (QID) | INTRAVENOUS | Status: DC
  Administered 2013-05-05 – 2013-05-07 (×8): 500 mg via INTRAVENOUS
  Filled 2013-05-05 (×13): qty 5

## 2013-05-05 NOTE — Evaluation (Signed)
Occupational Therapy Evaluation Patient Details Name: Travis Peters MRN: [id number] DOB: 12-08-1970 Today's Date: 05/05/2013 Time: 3149-7026 OT Time Calculation (min): 24 min  OT Assessment / Plan / Recommendation History of present illness Travis Peters is a 43 y.o. male who presents with a 3 months h/o left leg pain. Patient is s/p a lumbar decompressive procedure 5 years ago. 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 Patient c/o ongoing leg pain and weakness.  Now s/p  L5/S1 revision decompression and fusion L5/ S1    Clinical Impression   This 43 yo admitted and underwent above presents to acute OT with increased pain in back and LLE at rest and with movement, TLSO with leg component, back precautions--all affecting pt's ability to take care of himself. Will benefit from acute OT with follow up [place].    OT Assessment  Patient needs continued OT Services    Follow Up Recommendations  Home health OT       Equipment Recommendations  3 in 1 bedside comode;Tub/shower bench       Frequency  Min 3X/week    Precautions / Restrictions Precautions Precautions: Back Precaution Booklet Issued: No Required Braces or Orthoses: Spinal Brace Spinal Brace: Thoracolumbosacral orthotic;Applied in sitting position;Applied in standing position Spinal Brace Comments: with left thigh component Restrictions Weight Bearing Restrictions: No   Pertinent Vitals/Pain 7/10 back; pre-medicated per pt and RN    ADL  Eating/Feeding: Independent Where Assessed - Eating/Feeding: Bed level Grooming: Moderate assistance (due to pain in back with arm movements) Where Assessed - Grooming: Unsupported sitting Upper Body Bathing: Moderate assistance (due to pain in back with arm movements) Where Assessed - Upper Body Bathing: Supine, head of bed up Lower Body Bathing: +1 Total assistance Where Assessed - Lower Body Bathing: Supported sit to stand Upper Body  Dressing: Maximal assistance Where Assessed - Upper Body Dressing: Unsupported sitting Lower Body Dressing: +1 Total assistance Where Assessed - Lower Body Dressing: Supported sit to Lobbyist: Moderate assistance (+2) Toilet Transfer Method: Sit to Loss adjuster, chartered:  (Bed>couch<sit in recliner behind him) Electrical engineer and Hygiene: +1 Total assistance Where Assessed - Toileting Clothing Manipulation and Hygiene: Sit to stand from 3-in-1 or toilet Equipment Used: Back brace;Gait belt;Rolling walker Transfers/Ambulation Related to ADLs: +2 Mod A sit<>stand, min A ambulation ADL Comments: Has TLSO with leg component    OT Diagnosis: Generalized weakness;Acute pain  OT Problem List: Decreased strength;Decreased range of motion;Decreased activity tolerance;Impaired balance (sitting and/or standing);Decreased knowledge of use of DME or AE;Decreased knowledge of precautions;Pain OT Treatment Interventions: Self-care/ADL training;Patient/family education;Balance training;DME and/or AE instruction;Therapeutic activities   OT Goals(Current goals can be found in the care plan section) Acute Rehab OT Goals Patient Stated Goal: To return to independent/control pain OT Goal Formulation: With patient Time For Goal Achievement: 05/12/13 Potential to Achieve Goals: Good  Visit Information  Last OT Received On: 05/05/13 Assistance Needed: +2 (for safety) PT/OT/SLP Co-Evaluation/Treatment: Yes Reason for Co-Treatment: Complexity of the patient's impairments (multi-system involvement) PT goals addressed during session: Mobility/safety with mobility;Proper use of DME OT goals addressed during session: Proper use of Adaptive equipment and DME History of Present Illness: Travis Peters is a 43 y.o. male who presents with a 3 months h/o left leg pain. Patient is s/p a lumbar decompressive procedure 5 years ago. 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 Patient c/o ongoing leg pain  and weakness.  Now s/p  L5/S1 revision decompression and fusion L5/ S1        Prior [place] expects to be discharged to:: Private residence Living Arrangements: Other (Comment);Non-relatives/Friends Available Help at Discharge: Friend(s);Available 24 hours/day Type of Home: House Home Access: Stairs to enter [employer] of Steps: 2 Entrance Stairs-Rails: Left Home Layout: Two level;Able to live on main level with bedroom/bathroom;Bed/bath upstairs Home Equipment: Hand held shower head Additional Comments: has friend who is an Therapist, sports whom he will stay with while recouperating Prior Function Level of Independence: Independent Communication Communication: No difficulties Dominant Hand: Left         Vision/Perception Vision - History Patient Visual Report: No change from baseline   Cognition  Cognition Arousal/Alertness: Awake/alert Behavior During Therapy: WFL for tasks assessed/performed Overall Cognitive Status: Within Functional Limits for tasks assessed    Extremity/Trunk Assessment Upper Extremity Assessment Upper Extremity Assessment: Overall WFL for tasks assessed Lower Extremity Assessment Lower Extremity Assessment: RLE deficits/detail;LLE deficits/detail RLE Deficits / Details: AROM hip/knee flexion limited due to pain; AAROM WFL; strength NT due to pain RLE: Unable to fully assess due to pain RLE Sensation: decreased light touch (numbness in feet) LLE Deficits / Details: AROM hip/knee flexion limited due to pain; AAROM WFL; strength NT due to pain LLE: Unable to fully assess due to pain LLE Sensation: decreased light touch (numbness in feet)     Mobility Bed Mobility Overal bed mobility: + 2 for safety/equipment;Needs Assistance Bed Mobility: Rolling;Sidelying to Sit Rolling: Mod assist Sidelying to sit: Max assist;+2 for physical assistance General  bed mobility comments: one person assist with feet off bed and second to assist trunk upright, pt pushing up on raill; cues for technique and precautions Transfers Overall transfer level: Needs assistance Equipment used: Rolling walker (2 wheeled) Transfers: Sit to/from Stand Sit to Stand: Mod assist;+2 physical assistance;From elevated surface General transfer comment: difficulty from lower height of bed so lifted bed height to improve tolerance        Balance Balance Overall balance assessment: Needs assistance Sitting-balance support: Bilateral upper extremity supported Sitting balance-Leahy Scale: Poor Sitting balance - Comments: relies on UE assist due to pain/pressure in back Standing balance support: Bilateral upper extremity supported Standing balance-Leahy Scale: Poor Standing balance comment: heavy UE assist due to pain   End of Session OT - End of Session Equipment Utilized During Treatment: Gait belt;Rolling walker;Back brace Activity Tolerance: Patient limited by pain Patient left: in chair;with call bell/phone within reach Nurse Communication: Mobility status (NT also)    Almon Register [phone] 05/05/2013, 4:13 PM

## 2013-05-05 NOTE — Evaluation (Signed)
Physical Therapy Evaluation Patient Details Name: Travis Peters MRN: [id number] DOB: 02-Nov-1970 Today's Date: 05/05/2013 Time: 4098-1191 PT Time Calculation (min): 24 min  PT Assessment / Plan / Recommendation History of Present Illness  Travis Peters is a 43 y.o. male who presents with a 3 months h/o left leg pain. Patient is s/p a lumbar decompressive procedure 5 years ago. 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 Patient c/o ongoing leg pain and weakness.  Now s/p  L5/S1 revision decompression and fusion L5/ S1   Clinical Impression  Patient presents with decreased independence with mobility due to pain and deficits listed below.  He will benefit from skilled PT in the acute setting to allow return to independent following stay with friend and HHPT.    PT Assessment  Patient needs continued PT services    Follow Up Recommendations  Home health PT;Supervision/Assistance - 24 hour    Does the patient have the potential to tolerate intense rehabilitation    N/A  Barriers to Discharge  None      Equipment Recommendations  Rolling walker with 5" wheels    Recommendations for Other Services   None  Frequency Min 5X/week    Precautions / Restrictions Precautions Precautions: Back Precaution Booklet Issued: No Required Braces or Orthoses: Spinal Brace Spinal Brace: Thoracolumbosacral orthotic;Applied in sitting position;Applied in standing position;Other (comment) Spinal Brace Comments: with left thigh component   Pertinent Vitals/Pain Pain severe with mobility      Mobility  Bed Mobility Overal bed mobility: + 2 for safety/equipment;Needs Assistance Bed Mobility: Rolling;Sidelying to Sit Rolling: Mod assist Sidelying to sit: Max assist;+2 for physical assistance General bed mobility comments: one person assist with feet off bed and second to assist trunk upright, pt pushing up on raill; cues for technique and  precautions Transfers Overall transfer level: Needs assistance Equipment used: Rolling walker (2 wheeled) Transfers: Sit to/from Stand Sit to Stand: Mod assist;+2 physical assistance;From elevated surface General transfer comment: difficulty from lower height of bed so lifted bed height to improve tolerance Ambulation/Gait Ambulation/Gait assistance: Min assist Ambulation Distance (Feet): 10 Feet Assistive device: Rolling walker (2 wheeled) Gait Pattern/deviations: Step-to pattern;Decreased stride length General Gait Details: cues for technique, for posture, total assist +2 to don brace partially sitting, then completed in standing    Exercises     PT Diagnosis: Acute pain;Generalized weakness  PT Problem List: Decreased strength;Decreased activity tolerance;Decreased balance;Decreased knowledge of use of DME;Decreased mobility;Decreased safety awareness;Decreased knowledge of precautions PT Treatment Interventions: DME instruction;Therapeutic exercise;Gait training;Balance training;Stair training;Neuromuscular re-education;Functional mobility training;Therapeutic activities;Patient/family education     PT Goals(Current goals can be found in the care plan section) Acute Rehab PT Goals Patient Stated Goal: To return to independent/control pain PT Goal Formulation: With patient Time For Goal Achievement: 05/12/13 Potential to Achieve Goals: Good  Visit Information  Last PT Received On: 05/05/13 Assistance Needed: +2 (for safety) PT/OT/SLP Co-Evaluation/Treatment: Yes Reason for Co-Treatment: For patient/therapist safety PT goals addressed during session: Mobility/safety with mobility;Proper use of DME History of Present Illness: Travis Peters is a 43 y.o. male who presents with a 3 months h/o left leg pain. Patient is s/p a lumbar decompressive procedure 5 years ago. 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 Patient c/o ongoing leg pain and  weakness.  Now s/p  L5/S1 revision decompression and fusion L5/ S1        Prior Functioning  Home Living  Family/patient expects to be discharged to:: Private residence Living Arrangements: Other (Comment);Non-relatives/Friends Available Help at Discharge: Friend(s);Available 24 hours/day Type of Home: House Home Access: Stairs to enter [employer] of Steps: 2 Entrance Stairs-Rails: Left Home Layout: Two level;Able to live on main level with bedroom/bathroom;Bed/bath upstairs Home Equipment: Hand held shower head Additional Comments: has friend who is an Therapist, sports whom he will stay with while recouperating Prior Function Level of Independence: Independent Communication Communication: No difficulties Dominant Hand: Left    Cognition  Cognition Arousal/Alertness: Awake/alert Behavior During Therapy: WFL for tasks assessed/performed Overall Cognitive Status: Within Functional Limits for tasks assessed    Extremity/Trunk Assessment Upper Extremity Assessment Upper Extremity Assessment: Defer to OT evaluation Lower Extremity Assessment Lower Extremity Assessment: RLE deficits/detail;LLE deficits/detail RLE Deficits / Details: AROM hip/knee flexion limited due to pain; AAROM WFL; strength NT due to pain RLE: Unable to fully assess due to pain RLE Sensation: decreased light touch (numbness in feet) LLE Deficits / Details: AROM hip/knee flexion limited due to pain; AAROM WFL; strength NT due to pain LLE: Unable to fully assess due to pain LLE Sensation: decreased light touch (numbness in feet)   Balance Balance Overall balance assessment: Needs assistance Sitting-balance support: Bilateral upper extremity supported Sitting balance-Leahy Scale: Poor Sitting balance - Comments: relies on UE assist due to pain/pressure in back Standing balance support: Bilateral upper extremity supported Standing balance-Leahy Scale: Poor Standing balance comment: heavy UE assist due to pain  End  of Session PT - End of Session Equipment Utilized During Treatment: Gait belt Activity Tolerance: Patient tolerated treatment well Patient left: in chair;with call bell/phone within reach Nurse Communication: Mobility status  GP     [facility] 05/05/2013, 3:39 PM Travis Peters, Travis Peters 05/05/2013

## 2013-05-05 NOTE — Progress Notes (Signed)
Patient is s/p L5/S1 revision decompression and fusion, doing well. Constant leg pain has resolved. Patient has expected LBP and substantial back spasms. Patient does report intermittent leg pain when back spasms occur, but does report leg pain resolution when back spasms are not present. States the flexeril is more beneficial as compared to valium, and he was given an order yesterday for flexeril.  BP 118/57  Pulse 71  Temp(Src) 98.7 F (37.1 C) (Oral)  Resp 16  SpO2 98%  NVI Dressing CDI  POD #1 after L5/S1 decompression and fusion, doing well  - d/c PCA today - start oxycontin, percocet, valium, flexeril. Will also add IV robaxin ATC while IV is in place. - PT/OT - likely d/c home Friday/Saturday

## 2013-05-05 NOTE — Op Note (Signed)
Travis Peters, Travis Peters             ACCOUNT NO.:  [id number]  MEDICAL RECORD NO.:  [id number]  LOCATION:  5N23C                        FACILITY:  [facility]  PHYSICIAN:  Phylliss Bob, MD      DATE OF BIRTH:  19-May-1970  DATE OF PROCEDURE:  05/05/2013                              OPERATIVE REPORT   PREOPERATIVE DIAGNOSES: 1. Recurrent left-sided L5-S1 disk herniation. 2. L5-S1 degenerative disk disease. 3. 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 4. Left S1 radiculopathy.  POSTOPERATIVE DIAGNOSES: 1. Recurrent left-sided L5-S1 disk herniation. 2. L5-S1 degenerative disk disease. 3. 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 4. Left S1 radiculopathy.  PROCEDURE: 1. Complex revision decompression with left-sided transforaminal     lumbar interbody fusion. 2. Right-sided L5-S1 posterolateral fusion. 3. Placement of posterior instrumentation L5, S1 (7 mm screws). 4. Insertion of interbody device x1 (10 x 27 mm parallel Concorde     bullet cage). 5. Use of morselized allograft. 6. Use of local autograft. 7. Intraoperative use of fluoroscopy.  SURGEON:  Phylliss Bob, MD  ASSISTANT:  Pricilla Holm, PA-C  ANESTHESIA:  General endotracheal anesthesia.  COMPLICATIONS:  None.  DISPOSITION:  Stable.  ESTIMATED BLOOD LOSS:  200 mL.  INDICATIONS FOR PROCEDURE:  Briefly, Travis Peters is a pleasant 43 year old male who did initially present to me on March 29, 2013.  At that point, he did have a 55-month history of substantial pain in his left leg.  The pain was in the distribution of the S1 nerve on the left side. Of note, the patient is 5 years status post an L5-S1 decompression.  He states that, that procedure was for bilateral leg pain.  I did order and review an MRI, which clearly was notable for recurrent left-sided L5-S1 moderate disk herniation.  There was also noted to be a very  prominent epidural scar formation around the region of the left S1 nerve.  Given the fact this was a recurrent herniation resulting in severe pain, in addition to subjective and objective weakness, we did discuss proceeding with the procedure reflected above.  The patient did fully understand the risks and limitations of the procedure as outlined in my preoperative note.  Of note, the patient is a smoker.  He did understand that smoking does increase the risk of a nonunion.  OPERATIVE DETAILS:  On May 04, 2013, the patient was brought to surgery, and general endotracheal anesthesia was administered.  The patient was placed prone on a well-padded flat Jackson bed with spinal frame.  Antibiotics were given.  The back was prepped and draped in the usual sterile fashion and a time-out procedure was performed.  I then utilized the patient's previous incision, which was extended both superiorly and inferiorly.  The fascia was incised in the midline and the paraspinal musculature was bluntly swept laterally on the right and left sides.  The transverse processes of L5 and S1 on the right and left sides were subperiosteally exposed, as was the L5-S1 facet joint.  Using intraoperative fluoroscopy, in addition to the anatomic landmarks, I did cannulate the L5  and S1 pedicles.  I then used a 6-mm tap to cannulate the pedicles.  I did use a ball-tipped probe to confirm that there was no cortical violation of the cannulated pedicles.  I then placed pedicle screws into the L5 and S1 pedicles.  I then decorticated the posterior elements in addition to the transverse process of L5 and the sacral ala of S1.  A 35-mm rod was placed and distraction was applied across the rod.  I then turned my attention towards the patient's left side.  The pedicles were cannulated in the manner described previously.  Of note, the cannulated left L5 pedicle was immediately inferior to the L5 endplate, however, it did  not violate the endplate itself.  This again was confirmed using a ball-tipped probe.  I then placed bone wax in the cannulated pedicles.  I then performed a full facetectomy.  Of note, there was abundant and significant epidural scar formation identified in the epidural space.  This therefore was a meticulous aspect of the procedure, as I did need to ensure that a durotomy was not encountered. Safe plans were however developed.  I was able to identify the traversing S1 nerve.  There were multiple and significant adhesions identified about the S1 nerve.  The adhesions were taken down using a micro nerve hook.  The S1 nerve was then ultimately able to be medialized.  With an assistant holding medial retraction of the traversing S1 nerve, I did perform a thorough and complete diskectomy. Of note, there was an intervertebral herniated disk fragment, which was extruded.  This was removed uneventfully.  I then performed a diskectomy in a typical manner using a series of curettes and pituitary rongeurs. I was very pleased with the diskectomy.  I then placed a series of interbody trials and I did feel that a 10 mm x 27 mm interbody implant would be the most appropriate fit.  At this point, the intervertebral space was packed with DBX mix, in addition to autograft obtained from the decompression.  I then packed the implant with the same bone graft mixture and the endplate was secured.  At this point, the implant was tamped into position in the usual fashion.  I was very pleased with the final press-fit of the implant.  Appropriate positioning of the implant was confirmed using AP and lateral fluoroscopic imaging.  I then released the distraction on the contralateral side.  I then placed a 7 x 45 mm pedicle screw on the left at L5, and a 7 x 40 mm pedicle screw at S1 on the left.  Again, the L5 pedicle was immediately inferior to the endplate, which did result in excellent purchase of the screw.  I  was also able to gain access to the anterior cortex of S1 with the S1 pedicle screws bilaterally.  I then placed a 35 mm rod and caps were placed and a final locking procedure was performed.  On the right side, the bone graft mixture previously noted was packed into the posterolateral gutter and across the posterior elements to help aide in the success of the fusion.  The caps were final locked on the right side as well.  Prior to placement of the bone graft, the wound was copiously irrigated with approximately 1 L of normal saline.  I then explored the wound for any undue bleeding, and none was encountered.  I then closed the fascia using #1 Vicryl, the subcutaneous layer was then closed using 2-0 Vicryl, and the  skin was closed using 3-0 Monocryl.  Benzoin and Steri-Strips were applied followed by sterile dressing.  All instrument counts were correct at the termination of the procedure.  Of note, I did use neurologic monitoring throughout the surgery, and there was no abnormal EMG activity encountered throughout the surgery. I did also use triggered EMG to test the pedicle screws on the patient's left side.  There was no pedicle screw that tested below 20 milliamps.  Of note, Pricilla Holm was my assistant throughout the entirety of the procedure, and did aid in essential retraction and suctioning needed throughout the surgery.     Phylliss Bob, MD     MD/MEDQ  D:  05/04/2013  T:  05/05/2013  Job:  [id number]  cc:   Ane Payment, MD

## 2013-05-05 NOTE — Progress Notes (Signed)
Utilization review completed.  

## 2013-05-05 NOTE — Progress Notes (Signed)
PT Cancellation Note  Patient Details Name: Travis Peters MRN: [id number] DOB: 03/03/1971   Cancelled Treatment:     Pt is complaining of significant pain. I spoke with the RN who stated he has been given all the pain meds he can have. The pt reports his back is in a constant spasm and his pain is unbearable. Pt is unable to tolerate PT today. I have a call into the DR. Dumonski's office for order clarification of the TLSO. There is no order in the chart regarding a need for a brace and if it can be donned sitting EOB. RN is aware of the patients uncontrolled pain. Will follow-up for PT eval tomorrow.   Lelon Mast 05/05/2013, 11:41 AM

## 2013-05-05 NOTE — Progress Notes (Signed)
Patient screaming out c/o back pain " My back is killing me" called MD to inform him paitent has had all he can have for now IV med waiting for further orders

## 2013-05-05 NOTE — Progress Notes (Signed)
Dr, Lynann Bologna up to see patient meds given for pain and nausea patients states it was better but still some pain eyes closed resting

## 2013-05-06 ENCOUNTER — Encounter (HOSPITAL_COMMUNITY): Payer: Self-pay | Admitting: General Practice

## 2013-05-06 MED FILL — Sodium Chloride IV Soln 0.9%: INTRAVENOUS | Qty: 1000 | Status: AC

## 2013-05-06 MED FILL — Heparin Sodium (Porcine) Inj 1000 Unit/ML: INTRAMUSCULAR | Qty: 30 | Status: AC

## 2013-05-06 NOTE — Progress Notes (Signed)
Occupational Therapy Treatment Patient Details Name: GIACOMO VALONE MRN: [id number] DOB: March 19, 1971 Today's Date: 05/06/2013 Time: 1006-1020 OT Time Calculation (min): 14 min  OT Assessment / Plan / Recommendation  History of present illness JAMIRE SHABAZZ is a 43 y.o. male who presents with a 3 months h/o left leg pain. Patient is s/p a lumbar decompressive procedure 5 years ago. 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 Patient c/o ongoing leg pain and weakness.  Now s/p  L5/S1 revision decompression and fusion L5/ S1    OT comments  This 43 yo making progress, however pain is still an issue. Will continue to benefit from acute OT with follow up [place].  Follow Up Recommendations  Home health OT       Equipment Recommendations  3 in 1 bedside comode;Tub/shower bench       Frequency Min 3X/week   Progress towards OT Goals Progress towards OT goals: Progressing toward goals  Plan Discharge plan remains appropriate    Precautions / Restrictions Precautions Precautions: Back Precaution Comments: Pt able to state 1/3 precautions (no bending) Required Braces or Orthoses: Spinal Brace Spinal Brace: Thoracolumbosacral orthotic;Applied in sitting position;Applied in standing position;Other (comment) Spinal Brace Comments: with left thigh component Restrictions Weight Bearing Restrictions: No   Pertinent Vitals/Pain 7/10 back; repositioned    ADL  Lower Body Dressing: Minimal assistance (with AE) Where Assessed - Lower Body Dressing: Supported sit to stand Toilet Transfer: Minimal assistance Toilet Transfer Method: Sit to Loss adjuster, chartered:  (recliner>5 feet to bed) Equipment Used: Back brace;Gait belt;Rolling walker Transfers/Ambulation Related to ADLs: Min A with increased time from recliner.  ADL Comments: Pt educated on use of AE for LB ADLs and made aware he can get it in gift shop, also made him aware that tub equipment  will not be covered by insurance      OT Goals(current goals can now be found in the care plan section)    Visit Information  Last OT Received On: 05/06/13 Assistance Needed: +2 History of Present Illness: TAHJAI SCHETTER is a 43 y.o. male who presents with a 3 months h/o left leg pain. Patient is s/p a lumbar decompressive procedure 5 years ago. 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 Patient c/o ongoing leg pain and weakness.  Now s/p  L5/S1 revision decompression and fusion L5/ S1        Prior [place] expects to be discharged to:: Private residence Living Arrangements: Alone    Cognition  Cognition Arousal/Alertness: Awake/alert Behavior During Therapy: WFL for tasks assessed/performed Overall Cognitive Status: Within Functional Limits for tasks assessed    Mobility  Bed Mobility Overal bed mobility: Needs Assistance Bed Mobility: Sit to Supine Sit to supine: Mod assist Transfers Overall transfer level: Needs assistance Equipment used: Rolling walker (2 wheeled) Transfers: Sit to/from Stand Sit to Stand: Min assist          End of Session OT - End of Session Equipment Utilized During Treatment: Gait belt;Rolling walker;Back brace Activity Tolerance: Patient limited by pain Patient left: in bed;with call bell/phone within reach       Almon Register [phone] 05/06/2013, 1:23 PM

## 2013-05-06 NOTE — Progress Notes (Signed)
05/06/13 PT/OT recommending HHPT and HHOT. Spoke with patient, he plans to go to [place], [place] to stay with a friend who is an Therapist, sports and can take care of him since he lives alone. Patient does not know any MDs in the area. He gave me permission to contact his friend. Spoke with Clista Bernhardt [phone], she is not familiar with any [facility] agencies in the area. [place], they do not work with patient's insurance. Per RN at the agency [facility] agencies in the area would need a local MD for the patient. There are a couple outpatient therapy providers that may be able to work with patient. Updated the patient that we may not be able to set up [facility] in [place]. Fuller Plan RN, BSN, CCM

## 2013-05-06 NOTE — Progress Notes (Signed)
Patient 2 Days s/p L5/S1 revision decompression and fusion, doing well. Constant leg pain has resolved. Patient has expected LBP. Reports improvement i back spasms with flexeril and therapy yesterday. Patient does report intermittent leg pain when back spasms occur, but does report leg pain resolution when back spasms are not present. This is decreasing in frequency.   BP 121/69  Pulse 71  Temp(Src) 98 F (36.7 C) (Oral)  Resp 16  SpO2 97%  NVI, Dressing CDI, TLSO brace at bedside, SCD's in place. Pt sitting up in bed after completing breakfast.   POD #2 after L5/S1 decompression and fusion, doing well   - Continue oxycontin, percocet, valium, flexeril and robaxin ATC while IV is in place.  - Cont PT/OT   -TLSO brace at all times when OOB  -Back Precautions  -Rec HH upon D/C - likely d/c home Saturday after PT -Flexeril at home, scripts for percocet and valium in chart, likely D/C oxycontin at D/C

## 2013-05-06 NOTE — Progress Notes (Signed)
Physical Therapy Treatment Patient Details Name: Travis Peters MRN: [id number] DOB: April 10, 1970 Today's Date: 05/06/2013 Time: 0626-9485 PT Time Calculation (min): 30 min  PT Assessment / Plan / Recommendation  History of Present Illness Travis Peters is a 43 y.o. male who presents with a 3 months h/o left leg pain. Patient is s/p a lumbar decompressive procedure 5 years ago. 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 Patient c/o ongoing leg pain and weakness.  Now s/p  L5/S1 revision decompression and fusion L5/ S1    PT Comments   Pt with less pain today and doing slightly better. Donning brace in sitting required 2 person assist (pt too painful to attempt to do much of the work himself). ? If donning in Rt sidelying/supine might be somewhat easier? Discussed with Tye [place], OT and she will continue to problem solve best way to don brace. Contacted [employer] to bring pt some stockinette for Lt thigh (under brace). Noted pt's friend will be at hospital tomorrow around 11:00 for education.   Follow Up Recommendations  Home health PT;Supervision/Assistance - 24 hour     Does the patient have the potential to tolerate intense rehabilitation     Barriers to Discharge        Equipment Recommendations  Rolling walker with 5" wheels    Recommendations for Other Services    Frequency Min 5X/week   Progress towards PT Goals Progress towards PT goals: Progressing toward goals  Plan Current plan remains appropriate    Precautions / Restrictions Precautions Precautions: Back Precaution Comments: Pt able to state 1/3 precautions (no bending) Required Braces or Orthoses: Spinal Brace Spinal Brace: Thoracolumbosacral orthotic;Applied in sitting position;Applied in standing position;Other (comment) Spinal Brace Comments: with left thigh component   Pertinent Vitals/Pain 5/10 back pain (denied leg pain) pre-activity; with acitivity, leg pain returned  8/10  despite premedication    Mobility  Bed Mobility Overal bed mobility: Needs Assistance Bed Mobility: Rolling;Sidelying to Sit Rolling: Supervision Sidelying to sit: Min assist General bed mobility comments: using rail with HOB zero; vc for technique and precuations Transfers Overall transfer level: Needs assistance Equipment used: Rolling walker (2 wheeled) Transfers: Sit to/from Stand Sit to Stand: Mod assist;+2 physical assistance General transfer comment: pt unsure of height of bed at his friends, so bed set at lowest height today; vc for safe technique and use of RW; pt with difficulty posiitoning his LLE for brace to lock upon standing, also difficulty keeping leg extension unlocked when trying to sit Ambulation/Gait Ambulation/Gait assistance: Min assist;+2 safety/equipment Ambulation Distance (Feet): 25 Feet Assistive device: Rolling walker (2 wheeled) Gait Pattern/deviations: Step-to pattern;Decreased stride length;Decreased weight shift to left General Gait Details: cues for technique, for posture, total assist +2 to don brace partially sitting, then completed in standing    Exercises     PT Diagnosis:    PT Problem List:   PT Treatment Interventions:     PT Goals (current goals can now be found in the care plan section)    Visit Information  Last PT Received On: 05/06/13 Assistance Needed: +2 (donning brace and safety) History of Present Illness: Travis Peters is a 43 y.o. male who presents with a 3 months h/o left leg pain. Patient is s/p a lumbar decompressive procedure 5 years ago. 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 Patient c/o ongoing leg pain and weakness.  Now s/p  L5/S1 revision decompression and  fusion L5/ S1     Subjective Data      Cognition  Cognition Arousal/Alertness: Awake/alert Behavior During Therapy: WFL for tasks assessed/performed Overall Cognitive Status: Within Functional Limits for tasks  assessed    Balance  General Comments General comments (skin integrity, edema, etc.): Pt reported he has only tried donning brace in sitting/standing. Explained process for donning in supin/sidelying and that this might be easier for him (he would have to have assist, however he currently has to have assist)  End of Session PT - End of Session Equipment Utilized During Treatment: Gait belt;Back brace Activity Tolerance: Patient limited by pain Patient left: in chair;with call bell/phone within reach Nurse Communication: Mobility status;Other (comment) (? pt has personal meds in a cup in his room)   GP     Azaiah Mello 05/06/2013, 10:27 AM Pager [phone]

## 2013-05-07 MED ORDER — METHOCARBAMOL 100 MG/ML IJ SOLN
500.0000 mg | Freq: Four times a day (QID) | INTRAVENOUS | Status: DC | PRN
  Filled 2013-05-07: qty 5

## 2013-05-07 MED ORDER — ONDANSETRON HCL 4 MG PO TABS
4.0000 mg | ORAL_TABLET | ORAL | Status: DC | PRN
  Administered 2013-05-07 (×2): 4 mg via ORAL
  Filled 2013-05-07 (×2): qty 1

## 2013-05-07 MED ORDER — METHOCARBAMOL 500 MG PO TABS
500.0000 mg | ORAL_TABLET | Freq: Four times a day (QID) | ORAL | Status: DC
  Administered 2013-05-07 – 2013-05-08 (×6): 500 mg via ORAL
  Filled 2013-05-07 (×10): qty 1

## 2013-05-07 NOTE — Progress Notes (Signed)
Physical Therapy Treatment Patient Details Name: Travis Peters MRN: [id number] DOB: 12/02/1970 Today's Date: 05/07/2013 Time: 8242-3536 PT Time Calculation (min): 43 min  PT Assessment / Plan / Recommendation  History of Present Illness Travis Peters is a 43 y.o. male who presents with a 3 months h/o left leg pain. Patient is s/p a lumbar decompressive procedure 5 years ago. 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 Patient c/o ongoing leg pain and weakness.  Now s/p  L5/S1 revision decompression and fusion L5/ S1    PT Comments   Per orders OK to don brace in standing.  This was the technique used today and pt. Tolerated application fairly well in standing.  Travis Peters here and was educated in brace application.  Pt. Is still in a lot of pain with mobility and grimaces a lot but progressing overall.  Expect he will be ready for DC home with 24 hour assist after PT session tomorrow if Dr. Lynann Bologna is ready for him to DC from surgical standpoint.    Follow Up Recommendations  Home health PT;Supervision/Assistance - 24 hour     Does the patient have the potential to tolerate intense rehabilitation     Barriers to Discharge        Equipment Recommendations  Rolling walker with 5" wheels    Recommendations for Other Services    Frequency Min 5X/week   Progress towards PT Goals Progress towards PT goals: Progressing toward goals  Plan Current plan remains appropriate    Precautions / Restrictions Precautions Precautions: Back Precaution Comments: reinforcement of 3 back precautions, especially to avoid twisting in side to supine Required Braces or Orthoses: Spinal Brace Spinal Brace: Thoracolumbosacral orthotic;Applied in standing position (brace has L leg component) Spinal Brace Comments: with left thigh component   Pertinent Vitals/Pain See vitals tab Pt. Grimaces a lot and moves in guarded fashion; mobility increased pain but once pt.  Back to bed he states he was becoming more comfortable    Mobility  Bed Mobility Overal bed mobility: Needs Assistance Bed Mobility: Supine to Sit;Sit to Supine Rolling: Supervision Sidelying to sit: Min assist Sit to supine: Mod assist General bed mobility comments: rail used to assist, HOB zero, cues to keep LEs together as he moves sit to L sidelying as he tended to twist spine slightly Transfers Overall transfer level: Needs assistance Equipment used: Rolling walker (2 wheeled) Transfers: Sit to/from Stand Sit to Stand: Min assist General transfer comment: Friend Travis Peters present and says bed hight is on the high side.  Bed adjusted accordingly for practice.  Min assist for sit<>stand Ambulation/Gait Ambulation/Gait assistance: Min guard Ambulation Distance (Feet): 120 Feet (60 then 120 ) Assistive device: Rolling walker (2 wheeled) Gait Pattern/deviations: Decreased step length - right;Decreased step length - left;Step-to pattern Gait velocity: decreased General Gait Details: cues to manage drop lock on brake , min assist for safety and stability; brace applied and adjusted in standing position and doffed in standing after walk.   Stairs: Yes Stairs assistance: Min assist Stair Management: One rail Left;Step to pattern;Forwards Number of Stairs: 3 General stair comments: Good technique and his friend Travis Peters seems familiar with technique from her prior back surgeries    Exercises     PT Diagnosis:    PT Problem List:   PT Treatment Interventions:     PT Goals (current goals can now be found in the care plan section)    Visit Information  Last  PT Received On: 05/07/13 Assistance Needed: +2 (for brace application and to bring recliner chair) PT/OT/SLP Co-Evaluation/Treatment: Yes Reason for Co-Treatment: For patient/therapist safety PT goals addressed during session: Mobility/safety with mobility History of Present Illness: Travis Peters is a 43 y.o. male who presents  with a 3 months h/o left leg pain. Patient is s/p a lumbar decompressive procedure 5 years ago. 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 Patient c/o ongoing leg pain and weakness.  Now s/p  L5/S1 revision decompression and fusion L5/ S1     Subjective Data  Subjective: Pt. reports he expects he will be going home tomorrow to friend Travis Peters's home.  Travis Peters present and involved in session.   Cognition  Cognition Arousal/Alertness: Awake/alert Behavior During Therapy: WFL for tasks assessed/performed Overall Cognitive Status: Within Functional Limits for tasks assessed    Balance     End of Session PT - End of Session Equipment Utilized During Treatment: Gait belt;Back brace Activity Tolerance: Patient limited by pain Patient left: in bed;with call bell/phone within reach;with family/visitor present (pt. reqquested back to bed after session) Nurse Communication: Mobility status   GP     Travis Peters 05/07/2013, 12:05 PM Gerlean Ren PT Acute Rehab Services [place] [phone]

## 2013-05-07 NOTE — Progress Notes (Addendum)
Subjective: 3 Days Post-Op Procedure(s) (LRB): TRANSFORAMINAL LUMBAR INTERBODY FUSION (TLIF) WITH PEDICLE SCREW FIXATION 1 LEVEL (Left) Patient reports pain as 4 on 0-10 scale.   The patient reports central low back pain without radiation of pain or numbness and tingling into his legs.  He denies headache. He reports that when he is discharged from the hospital he has a friend who is a Equities trader that lives in [place].  He will be going there when he is discharged.  He states that she has some type of surgical procedure here locally on Monday so she cannot bring him to [place] until Monday.  He does admit that he may need to go to a hotel once he is discharged. Objective: Vital signs in last 24 hours: Temp:  [97.4 F (36.3 C)-98.6 F (37 C)] 98.4 F (36.9 C) (02/07 0453) Pulse Rate:  [63-70] 63 (02/07 0453) Resp:  [16-18] 18 (02/07 0453) BP: (112-126)/(60-64) 112/60 mmHg (02/07 0453) SpO2:  [95 %-98 %] 95 % (02/07 0453)  Intake/Output from previous day: 02/06 0701 - 02/07 0700 In: 480 [P.O.:480] Out: 2575 [Urine:2575] Intake/Output this shift: Total I/O In: 360 [P.O.:360] Out: -   Lumbar spine exam: Moderate.  Lumbar dressing drainage.  No active drainage from the wound.  No sign of lumbar hematoma/seroma. Bilateral calves are soft.  He has 5/5 motor strength in both lower extremities with normal sensation to soft touch.   Assessment/Plan: 3 Days Post-Op Procedure(s) (LRB): TRANSFORAMINAL LUMBAR INTERBODY FUSION (TLIF) WITH PEDICLE SCREW FIXATION 1 LEVEL (Left) Plan: His lumbar dressing is changed today. He will continue physical therapy for mobilization. Convert IV to saline lock. Disposition is an issue for him.  Hopefully he can be discharged  Tomorrow. I had a lengthy discussion with the patient about this. Continue oral pain medicines and muscle relaxers.  Maclovio Henson G 05/07/2013, 9:48 AM

## 2013-05-07 NOTE — Progress Notes (Signed)
Occupational Therapy Treatment Patient Details Name: SOLMON BOHR MRN: [id number] DOB: 11-05-1970 Today's Date: 05/07/2013 Time: 2778-2423 OT Time Calculation (min): 44 min  OT Assessment / Plan / Recommendation  History of present illness MARION ROSENBERRY is a 43 y.o. male who presents with a 3 months h/o left leg pain. Patient is s/p a lumbar decompressive procedure 5 years ago. 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 Patient c/o ongoing leg pain and weakness.  Now s/p  L5/S1 revision decompression and fusion L5/ S1    OT comments  Pt can use reinforcement with checking lock and releasing lock on TLSO brace.  He and friend verbalize understanding of ADLs with precautions.    Follow Up Recommendations  Supervision/Assistance - 24 hour    Barriers to Discharge       Equipment Recommendations  3 in 1 bedside comode    Recommendations for Other Services    Frequency Min 3X/week   Progress towards OT Goals Progress towards OT goals: Progressing toward goals  Plan Discharge plan remains appropriate    Precautions / Restrictions Precautions Precautions: Back Precaution Comments: reinforcement of 3 back precautions, especially to avoid twisting in side to supine Required Braces or Orthoses: Spinal Brace Spinal Brace: Thoracolumbosacral orthotic;Applied in standing position Spinal Brace Comments: with left thigh component Restrictions Weight Bearing Restrictions: No   Pertinent Vitals/Pain High pain (not rated) after PT/OT session. Repositioned and pain was decreasing    ADL  Lower Body Dressing: Minimal assistance (with reacher) Where Assessed - Lower Body Dressing: Supported sit to stand Toilet Transfer: Simulated;Minimal assistance (bed and chair) Toilet Transfer Method: Sit to stand Equipment Used: Back brace;Gait belt;Rolling walker Transfers/Ambulation Related to ADLs: min A ambulating and sit to stand; mod A with bed mobility ADL  Comments: Pt used reacher to don underwear. Friend he is staying with Investment banker, corporate) was here for education; both verbalize understanding of all.  Demonstrated tub bench transfer--uncertain when he will be allowed to bathe. Pt states he is fine with sponge bathing also.  Friend plans to get him an AE kit.  I demonstrated use of sock aide, which he used yesterday as his pain had increased    OT Diagnosis:    OT Problem List:   OT Treatment Interventions:     OT Goals(current goals can now be found in the care plan section)    Visit Information  Last OT Received On: 05/07/13 Assistance Needed: +2 PT/OT/SLP Co-Evaluation/Treatment:  (for brace/follow with chair) Reason for Co-Treatment: For patient/therapist safety PT goals addressed during session: Mobility/safety with mobility OT goals addressed during session: ADL's and self-care History of Present Illness: JACLYN CAREW is a 43 y.o. male who presents with a 3 months h/o left leg pain. Patient is s/p a lumbar decompressive procedure 5 years ago. 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 Patient c/o ongoing leg pain and weakness.  Now s/p  L5/S1 revision decompression and fusion L5/ S1     Subjective Data      Prior Functioning       Cognition  Cognition Arousal/Alertness: Awake/alert Behavior During Therapy: WFL for tasks assessed/performed Overall Cognitive Status: Within Functional Limits for tasks assessed    Mobility  Bed Mobility Overal bed mobility: Needs Assistance Bed Mobility: Supine to Sit;Sit to Supine Rolling: Supervision Sidelying to sit: Min assist Sit to supine: Mod assist General bed mobility comments: rail used to assist, HOB zero,  cues to keep LEs together as he moves sit to L sidelying as he tended to twist spine slightly Transfers Overall transfer level: Needs assistance Equipment used: Rolling walker (2 wheeled) Transfers: Sit to/from Stand Sit to Stand: Min assist General  transfer comment: Friend Janine present and says bed hight is on the high side.  Bed adjusted accordingly for practice.  Min assist for sit<>stand    Exercises      Balance    End of Session OT - End of Session Equipment Utilized During Treatment: Gait belt;Rolling walker;Back brace Activity Tolerance: Patient limited by pain Patient left: in bed;with call bell/phone within reach  [place] 05/07/2013, 12:40 PM Lesle Chris, OTR/L [phone] 05/07/2013

## 2013-05-08 MED ORDER — DIAZEPAM 5 MG PO TABS: 5.0000 mg | ORAL_TABLET | Freq: Four times a day (QID) | ORAL | Status: AC | PRN

## 2013-05-08 MED ORDER — OXYCODONE-ACETAMINOPHEN 5-325 MG PO TABS: 1.0000 | ORAL_TABLET | ORAL | Status: AC | PRN

## 2013-05-08 NOTE — Discharge Instructions (Signed)
Wear back brace when out of bed as instructed.

## 2013-05-08 NOTE — Progress Notes (Signed)
   CARE MANAGEMENT NOTE 05/08/2013  Patient:  Travis Peters, Travis Peters   Account Number:  [id number]  Date Initiated:  05/06/2013  Documentation initiated by:  [facility]  Subjective/Objective Assessment:   admitted s/p TLIF     Action/Plan:   PT/OT recommended HHPT/OT   Anticipated DC Date:  05/07/2013   Anticipated DC Plan:  [place]  CM consult      Choice offered to / List presented to:             Status of service:  Completed, signed off Medicare Important Message given?   (If response is "NO", the following Medicare IM given date fields will be blank) Date Medicare IM given:   Date Additional Medicare IM given:    Discharge Disposition:  HOME/SELF CARE  Per UR Regulation:    If discussed at Long Length of Stay Meetings, dates discussed:    Comments:  05/08/13 14:30 CM spoke with pt who states he will pursue outpt therapy once he gets to [place].  DME delivery person will deliver RW and 3n1 to room prior to discharge. No other CM needs were communicated.  Mariane Masters, BSN, Jearld Lesch [phone].  05/06/13 PT/OT recommending HHPT and HHOT. Spoke with patient, he plan to go to [place], [place] to stay with a friend who is an Therapist, sports and can take care of him since he lives alone. Patient does not know any MDs in the area. He gave me permission to contact his friend. Spoke with Clista Bernhardt [phone], she is not familiar with any [facility] agencies in the area. [place], they do not work with patient's insurance. Per RN at the agencies [facility] agencies in the area would need a local MD for the patient. There are a couple outpatient therapy providers that may be able to work with patient. Updated the patient that we may not be able to set up [facility] in [place]. Fuller Plan RN, BSN, CCM

## 2013-05-08 NOTE — Progress Notes (Signed)
Subjective: 4 Days Post-Op Procedure(s) (LRB): TRANSFORAMINAL LUMBAR INTERBODY FUSION (TLIF) WITH PEDICLE SCREW FIXATION 1 LEVEL (Left) Patient reports pain as 4 on 0-10 scale.  C/O LBP without leg sx's. Denies H/A. Has RN friend who plans on taking him to [place] tomorrow to recover. Good progress with PT.   Objective: Vital signs in last 24 hours: Temp:  [97.9 F (36.6 C)-98.3 F (36.8 C)] 97.9 F (36.6 C) (02/08 0500) Pulse Rate:  [72-85] 74 (02/08 0500) Resp:  [16] 16 (02/08 0500) BP: (115-130)/(62-98) 120/62 mmHg (02/08 0500) SpO2:  [98 %-100 %] 100 % (02/08 0500)  Intake/Output from previous day: 02/07 0701 - 02/08 0700 In: 7943 [P.O.:1320; I.V.:3] Out: -  Intake/Output this shift:    Lumbar dressing clean and dry.No redness around dressing. Good motor strength in both legs. Calves soft and non tender.   Assessment/Plan: 4 Days Post-Op Procedure(s) (LRB): TRANSFORAMINAL LUMBAR INTERBODY FUSION (TLIF) WITH PEDICLE SCREW FIXATION 1 LEVEL (Left) Plan: Discharge today. D/C IV. Has RN friend who can care for him. Rx's given. Wear TLSO brace when up. F/U with Dr Lynann Bologna in 10-14 days.   Kashish Yglesias G 05/08/2013, 9:38 AM

## 2013-05-08 NOTE — Progress Notes (Signed)
PT Cancellation Note  Patient Details Name: RAMESH MOAN MRN: [id number] DOB: 11/09/1970   Cancelled Treatment:    Reason Eval/Treat Not Completed: Fatigue/lethargy limiting ability to participate. Pt reports that he feels he overdid it yesterday and has been very tired and sore today. States he has been up to the bathroom multiple times today and wants to save his energy for the trip home this afternoon. Pt has no questions or concerns regarding mobility or brace donning/doffing, stating everything was covered in prior PT session. Will need RW and tub bench at d/c.   Jolyn Lent 05/08/2013, 2:22 PM  Jolyn Lent, [place], DPT [phone]

## 2013-05-12 NOTE — Discharge Summary (Signed)
Patient ID: Travis Peters MRN: [id number] DOB/AGE: 1970-07-21 43 y.o.  Admit date: 05/04/2013 Discharge date:  05/08/2013  Admission Diagnoses:  Active Problems:   Radiculopathy   Discharge Diagnoses:  Same  Past Medical History  Diagnosis Date  . History of kidney stones 2014  . Current smoker   . Herniated intervertebral disc   . Hemangioma of liver     spleen and pancreas  . Irritable bowel syndrome (IBS)   . Radiculopathy of leg     LEFT LEG    Surgeries: Procedure(s): TRANSFORAMINAL LUMBAR INTERBODY FUSION (TLIF) WITH PEDICLE SCREW FIXATION 1 LEVEL L:5-S1 on 05/04/2013   Discharged Condition: Improved  Hospital Course: Travis Peters is an 43 y.o. male who was admitted 05/04/2013 for operative treatment of radiculopathy. Patient has severe unremitting pain that affects sleep, daily activities, and work/hobbies. After pre-op clearance the patient was taken to the operating room on 05/04/2013 and underwent  Procedure(s): TRANSFORAMINAL LUMBAR INTERBODY FUSION (TLIF) WITH PEDICLE SCREW FIXATION 1 LEVEL. L5-S1   Patient was given perioperative antibiotics:  Anti-infectives   Start     Dose/Rate Route Frequency Ordered Stop   05/04/13 1800  ceFAZolin (ANCEF) IVPB 1 g/50 mL premix     1 g 100 mL/hr over 30 Minutes Intravenous Every 8 hours 05/04/13 1742 05/05/13 0142   05/04/13 0600  ceFAZolin (ANCEF) IVPB 2 g/50 mL premix     2 g 100 mL/hr over 30 Minutes Intravenous On call to O.R. 05/03/13 1442 05/04/13 0902       Patient was given sequential compression devices, early ambulation to prevent DVT.  Patient benefited maximally from hospital stay and there were no complications.    Recent vital signs: BP 121/72  Pulse 81  Temp(Src) 97.9 F (36.6 C) (Oral)  Resp 16  SpO2 97%  Discharge Medications:     Medication List    STOP taking these medications       cyclobenzaprine 10 MG tablet  Commonly known as:  FLEXERIL     tapentadol 50 MG Tabs tablet  Commonly  known as:  NUCYNTA      TAKE these medications       acetaminophen 500 MG tablet  Commonly known as:  TYLENOL  Take 500 mg by mouth 3 (three) times daily. scheduled     clonazePAM 0.5 MG tablet  Commonly known as:  KLONOPIN  Take 0.5 mg by mouth as needed for anxiety.     diazepam 5 MG tablet  Commonly known as:  VALIUM  Take 1 tablet (5 mg total) by mouth every 6 (six) hours as needed for muscle spasms.     escitalopram 20 MG tablet  Commonly known as:  LEXAPRO  Take 20 mg by mouth daily.     oxyCODONE-acetaminophen 5-325 MG per tablet  Commonly known as:  PERCOCET/ROXICET  Take 1-2 tablets by mouth every 4 (four) hours as needed for moderate pain.     pregabalin 75 MG capsule  Commonly known as:  LYRICA  Take 75 mg by mouth at bedtime as needed (nerve pain).        Diagnostic Studies: Dg Chest 2 View  04/22/2013   CLINICAL DATA:  Preoperative risk prior to exam for back surgery.  EXAM: CHEST  2 VIEW  COMPARISON:  None.  FINDINGS: The heart size and mediastinal contours are within normal limits. Both lungs are clear. The visualized skeletal structures are unremarkable.  IMPRESSION: Normal chest   Electronically Signed   By: Elta Guadeloupe  Shogry M.D.   On: 04/22/2013 16:35   Dg Lumbar Spine 2-3 Views  05/04/2013   CLINICAL DATA:  L5-S1 posterior lumbar interbody fusion  EXAM: DG C-ARM GT 120 MIN; LUMBAR SPINE - 2-3 VIEW  TECHNIQUE: Frontal and lateral intraoperative spot radiographs  COMPARISON:  Intraoperative radiographs obtained earlier today  FLUOROSCOPY TIME:  13.8 seconds reported. Please see operative report for further detail.  FINDINGS: Frontal and lateral radiographs demonstrate completed surgical changes of L5-S1 posterior lumbar interbody fusion. Bilateral pedicle screw and rod constructs present without evidence of hardware complication. An interbody graft is noted within the disc space. Probable L5 laminectomy defect.  IMPRESSION: L5-S1 posterior lumbar interbody fusion as  described   Electronically Signed   By: Jacqulynn Cadet M.D.   On: 05/04/2013 14:36   Dg Lumbar Spine 1 View  05/04/2013   CLINICAL DATA:  Instrument localization for L5-S1 TLIF.  EXAM: LUMBAR SPINE - 1 VIEW  COMPARISON:  None.  FINDINGS: There are no preoperative examinations for comparison. A single intraoperative cross-table lateral view of lumbar spine, taken aunt 0916 hr, shows a surgical instrument tip projecting posterior to the L5-S1 level.  IMPRESSION: Intraoperative localization at the L5-S1 level.   Electronically Signed   By: Lorin Picket M.D.   On: 05/04/2013 12:35   Dg C-arm Gt 120 Min  05/04/2013   CLINICAL DATA:  L5-S1 posterior lumbar interbody fusion  EXAM: DG C-ARM GT 120 MIN; LUMBAR SPINE - 2-3 VIEW  TECHNIQUE: Frontal and lateral intraoperative spot radiographs  COMPARISON:  Intraoperative radiographs obtained earlier today  FLUOROSCOPY TIME:  13.8 seconds reported. Please see operative report for further detail.  FINDINGS: Frontal and lateral radiographs demonstrate completed surgical changes of L5-S1 posterior lumbar interbody fusion. Bilateral pedicle screw and rod constructs present without evidence of hardware complication. An interbody graft is noted within the disc space. Probable L5 laminectomy defect.  IMPRESSION: L5-S1 posterior lumbar interbody fusion as described   Electronically Signed   By: Jacqulynn Cadet M.D.   On: 05/04/2013 14:36    Disposition: 06-Home-Health Care Svc      Discharge Orders   Future Orders Complete By Expires   Call MD / Call 911  As directed    Comments:     If you experience chest pain or shortness of breath, CALL 911 and be transported to the hospital emergency room.  If you develope a fever above 101 F, pus (white drainage) or increased drainage or redness at the wound, or calf pain, call your surgeon's office.   Constipation Prevention  As directed    Comments:     Drink plenty of fluids.  Prune juice may be helpful.  You may use a  stool softener, such as Colace (over the counter) 100 mg twice a day.  Use MiraLax (over the counter) for constipation as needed.   Diet general  As directed    Driving restrictions  As directed    Comments:     No driving for 2-3 weeks   Increase activity slowly as tolerated  As directed    Lifting restrictions  As directed    Comments:     No lifting for 6-8 weeks      Follow-up Information   Follow up with Sinclair Ship, MD. Schedule an appointment as soon as possible for a visit on 05/18/2013.   Specialty:  Orthopedic Surgery   Contact information:   [address] [phone]  Follow up with [place]. (for rolling walker and 3n1)    Contact information:   [place] [id number] [phone]      4 Days Post-Op Procedure: L5/S1 decompression and fusion  Plan:  Discharge today.  D/C IV.  Has RN friend who can care for him.  Rx's given.  Wear TLSO brace when up.  F/U with Dr Lynann Bologna in 10-14 days.  SignedJustice Britain 05/12/2013, 8:03 AM

## 2014-11-08 IMAGING — CR DG LUMBAR SPINE 1V
1 series · 1 of 1 positions shown · non-contrast
Comparison: None.

CLINICAL DATA: Instrument localization for L5-S1 TLIF.

EXAM:
LUMBAR SPINE - 1 VIEW

[AP]
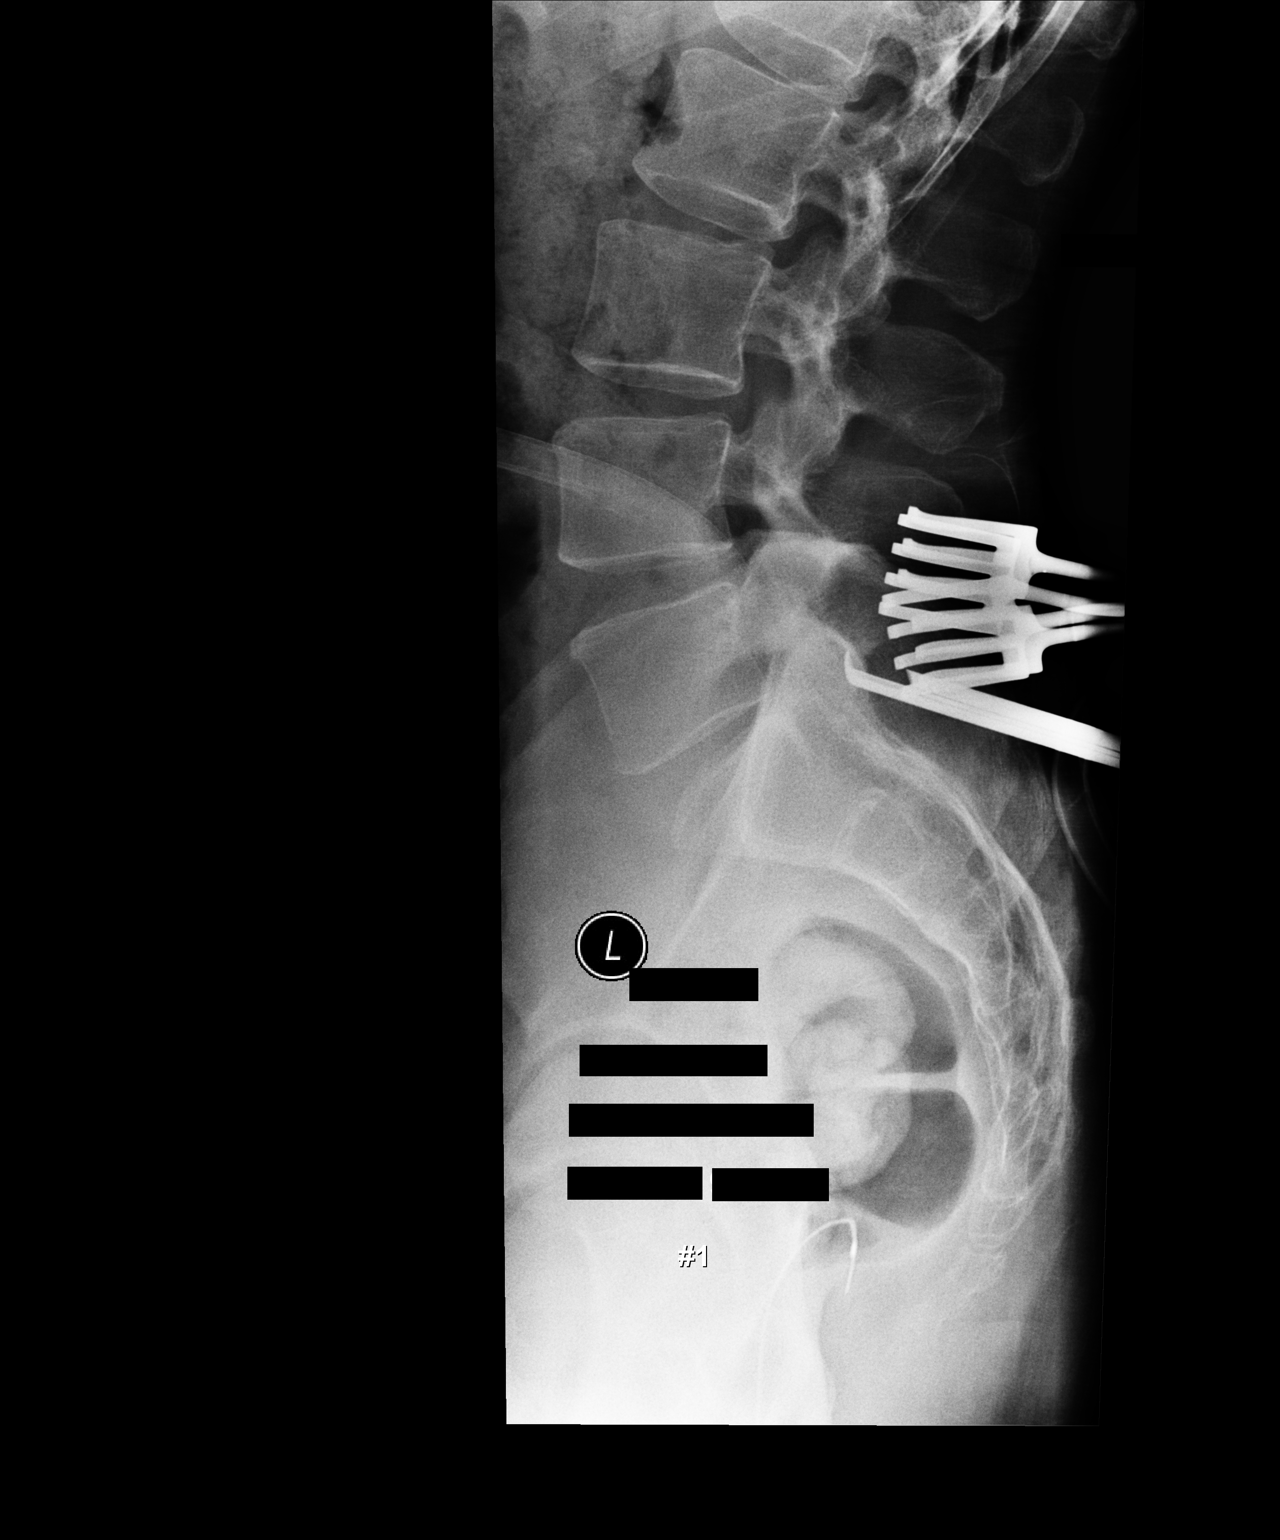

[1 of 1 positions shown; findings below may reference images not displayed]

FINDINGS: There are no preoperative examinations for comparison. A single
intraoperative cross-table lateral view of lumbar spine, taken aunt
3222 hr, shows a surgical instrument tip projecting posterior to the
L5-S1 level.
IMPRESSION: Intraoperative localization at the L5-S1 level.

## 2014-11-08 IMAGING — RF DG LUMBAR SPINE 2-3V
1 series · 2 of 2 positions shown · non-contrast
Comparison: Intraoperative radiographs obtained earlier today

CLINICAL DATA: L5-S1 posterior lumbar interbody fusion

EXAM:
DG C-ARM GT 120 MIN; LUMBAR SPINE - 2-3 VIEW
TECHNIQUE: Frontal and lateral intraoperative spot radiographs

[Series 1: run · 2 of 2 slices shown]
[im 1/2]
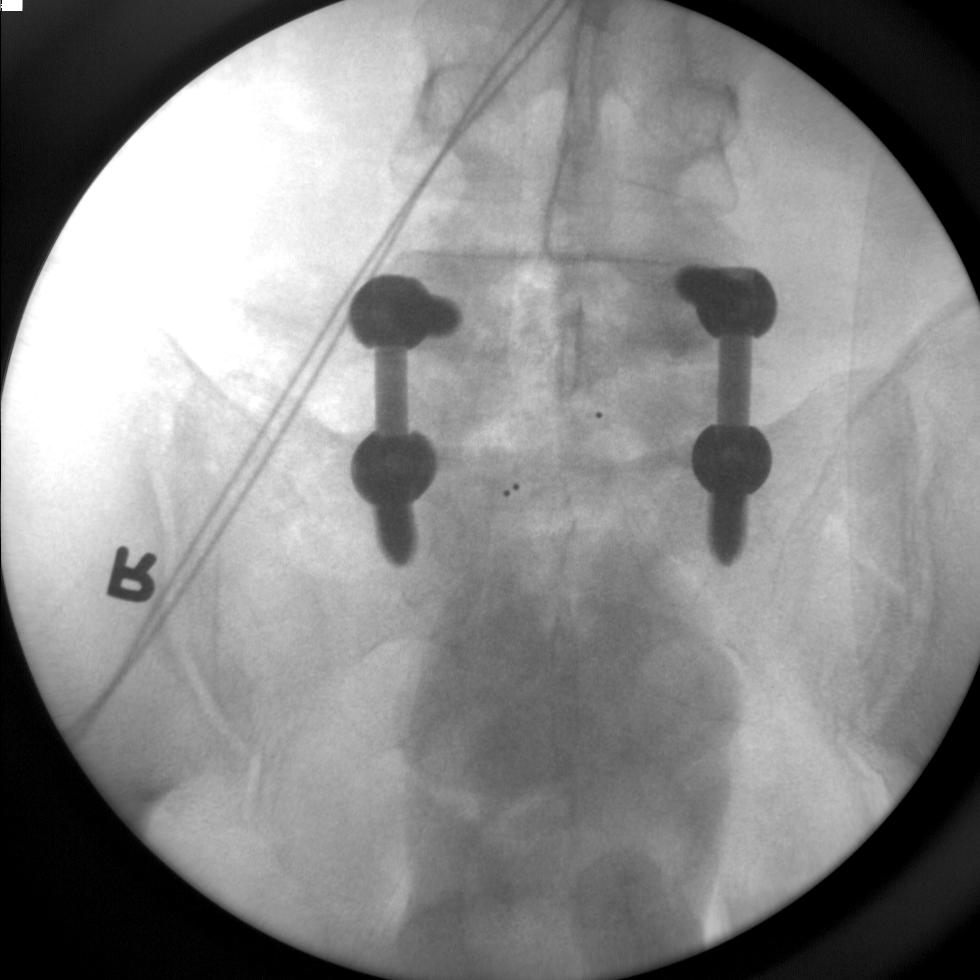
[im 2/2]
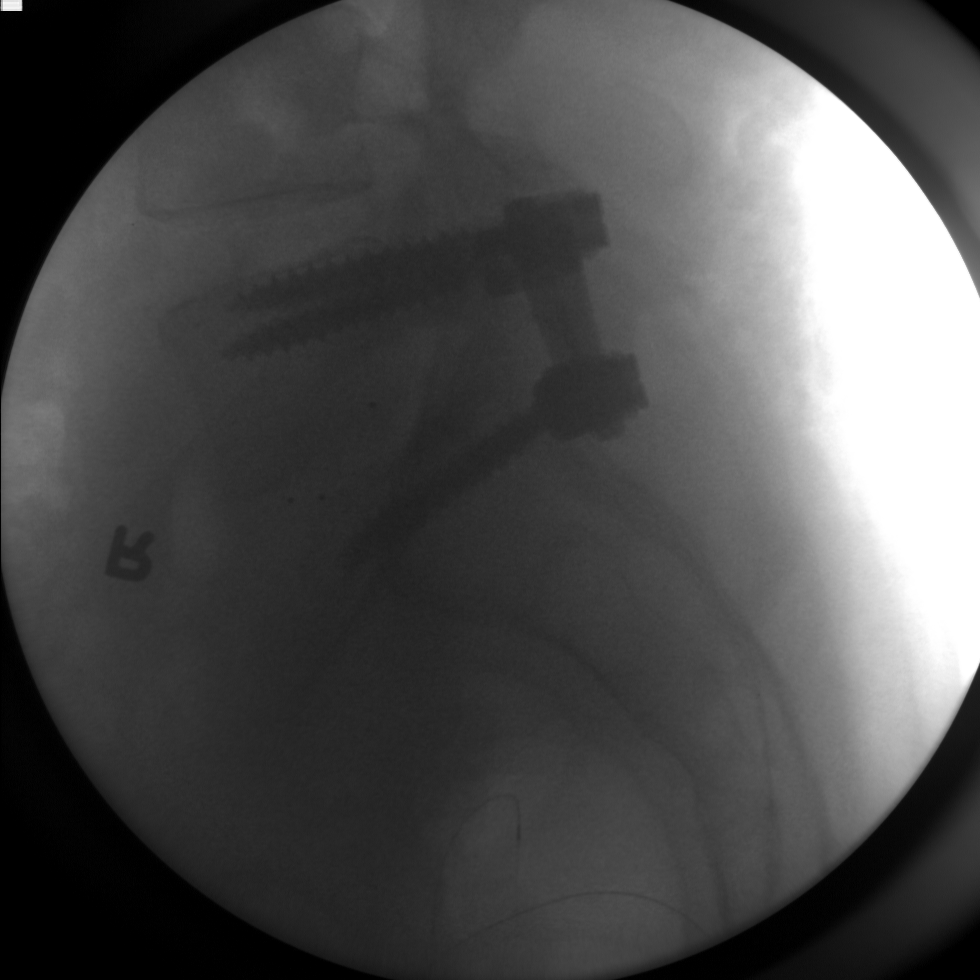

[2 of 2 positions shown; findings below may reference images not displayed]

FLUOROSCOPY TIME:  13.8 seconds reported. Please see operative
report for further detail.
FINDINGS: Frontal and lateral radiographs demonstrate completed surgical
changes of L5-S1 posterior lumbar interbody fusion. Bilateral
pedicle screw and rod constructs present without evidence of
hardware complication. An interbody graft is noted within the disc
space. Probable L5 laminectomy defect.
IMPRESSION: L5-S1 posterior lumbar interbody fusion as described
